# Patient Record
Sex: Male | Born: 1975 | Race: White | Hispanic: No | Smoking: Current every day smoker
Health system: Southern US, Community
[De-identification: ages and names within clinical notes are randomized; demographics above are authoritative.]

## PROBLEM LIST (undated history)

## (undated) DIAGNOSIS — Z22322 Carrier or suspected carrier of Methicillin resistant Staphylococcus aureus: Secondary | ICD-10-CM

## (undated) DIAGNOSIS — K219 Gastro-esophageal reflux disease without esophagitis: Secondary | ICD-10-CM

## (undated) DIAGNOSIS — J382 Nodules of vocal cords: Secondary | ICD-10-CM

## (undated) DIAGNOSIS — R1115 Cyclical vomiting syndrome unrelated to migraine: Secondary | ICD-10-CM

## (undated) HISTORY — PX: HAND SURGERY: SHX662

## (undated) HISTORY — PX: ABDOMINAL SURGERY: SHX537

## (undated) HISTORY — PX: OTHER SURGICAL HISTORY: SHX169

---

## 2001-06-12 ENCOUNTER — Emergency Department (HOSPITAL_COMMUNITY): Admission: EM | Admit: 2001-06-12 | Discharge: 2001-06-12 | Payer: Self-pay | Admitting: Emergency Medicine

## 2001-07-29 ENCOUNTER — Emergency Department (HOSPITAL_COMMUNITY): Admission: EM | Admit: 2001-07-29 | Discharge: 2001-07-29 | Payer: Self-pay | Admitting: *Deleted

## 2001-11-01 ENCOUNTER — Encounter (INDEPENDENT_AMBULATORY_CARE_PROVIDER_SITE_OTHER): Payer: Self-pay | Admitting: *Deleted

## 2001-11-01 ENCOUNTER — Inpatient Hospital Stay (HOSPITAL_COMMUNITY): Admission: AC | Admit: 2001-11-01 | Discharge: 2001-11-07 | Payer: Self-pay

## 2001-11-01 ENCOUNTER — Encounter: Payer: Self-pay | Admitting: General Surgery

## 2001-11-30 ENCOUNTER — Ambulatory Visit (HOSPITAL_COMMUNITY): Admission: RE | Admit: 2001-11-30 | Discharge: 2001-11-30 | Payer: Self-pay | Admitting: General Surgery

## 2002-09-28 ENCOUNTER — Emergency Department (HOSPITAL_COMMUNITY): Admission: EM | Admit: 2002-09-28 | Discharge: 2002-09-28 | Payer: Self-pay | Admitting: Emergency Medicine

## 2002-12-28 ENCOUNTER — Emergency Department (HOSPITAL_COMMUNITY): Admission: EM | Admit: 2002-12-28 | Discharge: 2002-12-28 | Payer: Self-pay | Admitting: Emergency Medicine

## 2003-09-08 ENCOUNTER — Emergency Department (HOSPITAL_COMMUNITY): Admission: EM | Admit: 2003-09-08 | Discharge: 2003-09-08 | Payer: Self-pay | Admitting: Emergency Medicine

## 2004-09-13 ENCOUNTER — Emergency Department (HOSPITAL_COMMUNITY): Admission: EM | Admit: 2004-09-13 | Discharge: 2004-09-13 | Payer: Self-pay | Admitting: Emergency Medicine

## 2005-03-09 ENCOUNTER — Emergency Department (HOSPITAL_COMMUNITY): Admission: EM | Admit: 2005-03-09 | Discharge: 2005-03-09 | Payer: Self-pay | Admitting: Emergency Medicine

## 2006-04-09 ENCOUNTER — Inpatient Hospital Stay (HOSPITAL_COMMUNITY): Admission: EM | Admit: 2006-04-09 | Discharge: 2006-04-12 | Payer: Self-pay | Admitting: Emergency Medicine

## 2006-06-07 ENCOUNTER — Inpatient Hospital Stay (HOSPITAL_COMMUNITY): Admission: EM | Admit: 2006-06-07 | Discharge: 2006-06-12 | Payer: Self-pay | Admitting: Emergency Medicine

## 2006-06-08 ENCOUNTER — Ambulatory Visit: Payer: Self-pay | Admitting: Orthopedic Surgery

## 2006-06-10 ENCOUNTER — Ambulatory Visit: Payer: Self-pay | Admitting: Infectious Diseases

## 2007-03-25 ENCOUNTER — Emergency Department (HOSPITAL_COMMUNITY): Admission: EM | Admit: 2007-03-25 | Discharge: 2007-03-25 | Payer: Self-pay | Admitting: Emergency Medicine

## 2007-03-27 ENCOUNTER — Inpatient Hospital Stay (HOSPITAL_COMMUNITY): Admission: EM | Admit: 2007-03-27 | Discharge: 2007-03-30 | Payer: Self-pay | Admitting: Emergency Medicine

## 2007-04-18 ENCOUNTER — Emergency Department (HOSPITAL_COMMUNITY): Admission: EM | Admit: 2007-04-18 | Discharge: 2007-04-18 | Payer: Self-pay | Admitting: Emergency Medicine

## 2007-04-19 ENCOUNTER — Emergency Department (HOSPITAL_COMMUNITY): Admission: EM | Admit: 2007-04-19 | Discharge: 2007-04-19 | Payer: Self-pay | Admitting: Emergency Medicine

## 2007-06-05 ENCOUNTER — Emergency Department (HOSPITAL_COMMUNITY): Admission: EM | Admit: 2007-06-05 | Discharge: 2007-06-05 | Payer: Self-pay | Admitting: Emergency Medicine

## 2007-09-05 ENCOUNTER — Emergency Department (HOSPITAL_COMMUNITY): Admission: EM | Admit: 2007-09-05 | Discharge: 2007-09-05 | Payer: Self-pay | Admitting: Emergency Medicine

## 2007-10-09 ENCOUNTER — Emergency Department (HOSPITAL_COMMUNITY): Admission: EM | Admit: 2007-10-09 | Discharge: 2007-10-09 | Payer: Self-pay | Admitting: Emergency Medicine

## 2007-10-31 ENCOUNTER — Emergency Department (HOSPITAL_COMMUNITY): Admission: EM | Admit: 2007-10-31 | Discharge: 2007-11-01 | Payer: Self-pay | Admitting: Emergency Medicine

## 2007-12-13 ENCOUNTER — Emergency Department (HOSPITAL_COMMUNITY): Admission: EM | Admit: 2007-12-13 | Discharge: 2007-12-13 | Payer: Self-pay | Admitting: Emergency Medicine

## 2008-02-15 ENCOUNTER — Emergency Department (HOSPITAL_COMMUNITY): Admission: EM | Admit: 2008-02-15 | Discharge: 2008-02-15 | Payer: Self-pay | Admitting: Emergency Medicine

## 2008-04-02 ENCOUNTER — Emergency Department (HOSPITAL_COMMUNITY): Admission: EM | Admit: 2008-04-02 | Discharge: 2008-04-02 | Payer: Self-pay | Admitting: Emergency Medicine

## 2008-04-19 ENCOUNTER — Emergency Department (HOSPITAL_COMMUNITY): Admission: EM | Admit: 2008-04-19 | Discharge: 2008-04-19 | Payer: Self-pay | Admitting: Emergency Medicine

## 2008-05-07 ENCOUNTER — Emergency Department (HOSPITAL_COMMUNITY): Admission: EM | Admit: 2008-05-07 | Discharge: 2008-05-07 | Payer: Self-pay | Admitting: Emergency Medicine

## 2008-05-09 ENCOUNTER — Emergency Department (HOSPITAL_COMMUNITY): Admission: EM | Admit: 2008-05-09 | Discharge: 2008-05-09 | Payer: Self-pay | Admitting: Emergency Medicine

## 2008-05-24 ENCOUNTER — Emergency Department (HOSPITAL_COMMUNITY): Admission: EM | Admit: 2008-05-24 | Discharge: 2008-05-24 | Payer: Self-pay | Admitting: Emergency Medicine

## 2008-10-26 ENCOUNTER — Inpatient Hospital Stay (HOSPITAL_COMMUNITY): Admission: EM | Admit: 2008-10-26 | Discharge: 2008-10-27 | Payer: Self-pay | Admitting: Emergency Medicine

## 2008-10-31 ENCOUNTER — Emergency Department (HOSPITAL_COMMUNITY): Admission: EM | Admit: 2008-10-31 | Discharge: 2008-10-31 | Payer: Self-pay | Admitting: Emergency Medicine

## 2008-11-01 ENCOUNTER — Ambulatory Visit (HOSPITAL_COMMUNITY): Admission: RE | Admit: 2008-11-01 | Discharge: 2008-11-01 | Payer: Self-pay | Admitting: Urology

## 2008-11-02 ENCOUNTER — Inpatient Hospital Stay (HOSPITAL_COMMUNITY): Admission: AD | Admit: 2008-11-02 | Discharge: 2008-11-06 | Payer: Self-pay | Admitting: Urology

## 2008-11-24 ENCOUNTER — Emergency Department (HOSPITAL_COMMUNITY): Admission: EM | Admit: 2008-11-24 | Discharge: 2008-11-24 | Payer: Self-pay | Admitting: Emergency Medicine

## 2008-12-07 ENCOUNTER — Emergency Department (HOSPITAL_COMMUNITY): Admission: EM | Admit: 2008-12-07 | Discharge: 2008-12-07 | Payer: Self-pay | Admitting: Emergency Medicine

## 2008-12-09 ENCOUNTER — Emergency Department (HOSPITAL_COMMUNITY): Admission: EM | Admit: 2008-12-09 | Discharge: 2008-12-09 | Payer: Self-pay | Admitting: Emergency Medicine

## 2009-01-07 ENCOUNTER — Emergency Department (HOSPITAL_COMMUNITY): Admission: EM | Admit: 2009-01-07 | Discharge: 2009-01-07 | Payer: Self-pay | Admitting: Emergency Medicine

## 2009-12-26 IMAGING — CT CT PELVIS W/O CM
1 series · 15 of 32 positions shown, 19 images · non-contrast
Comparison: CT pelvis 09/13/2004

CLINICAL DATA: Right groin pain, inflammatory process of right
hemiscrotum on prior ultrasound of 10/27/2008

CT PELVIS WITHOUT CONTRAST
TECHNIQUE: Multidetector CT imaging of the pelvis was performed
following the standard protocol without intravenous contrast.

[Series 2: pelvis 5.0 b40f · axial · 0.78mm/px · z∈[-804,-514]mm · 15 of 66 slices shown, 19 images]
[im 5/66  soft-tissue]
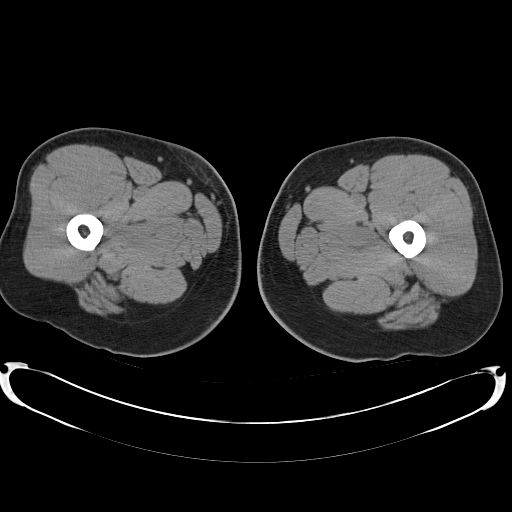
[im 5/66  bone]
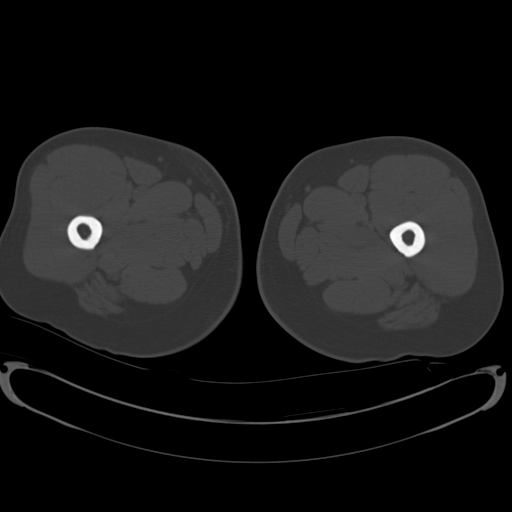
[im 9/66  soft-tissue]
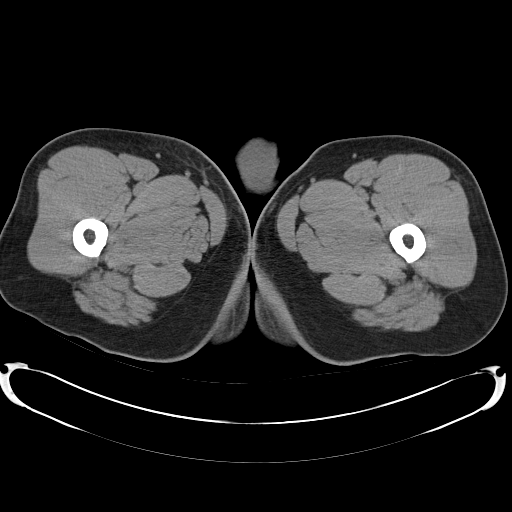
[im 13/66  soft-tissue]
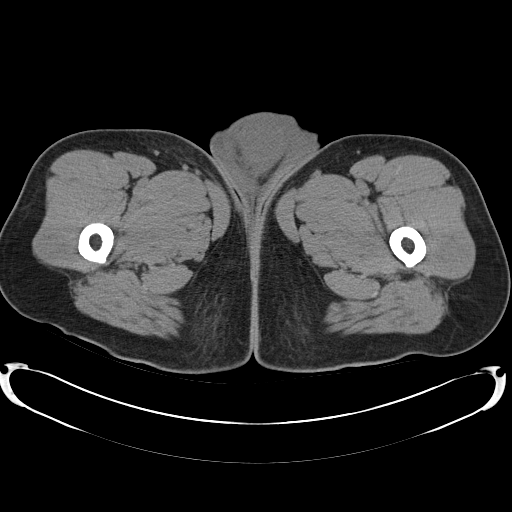
[im 19/66  soft-tissue]
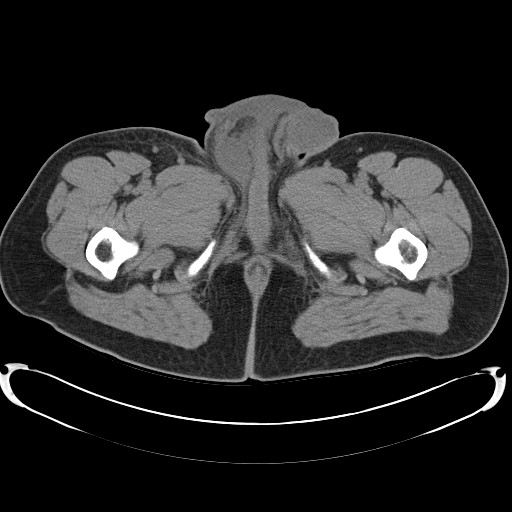
[im 24/66  soft-tissue]
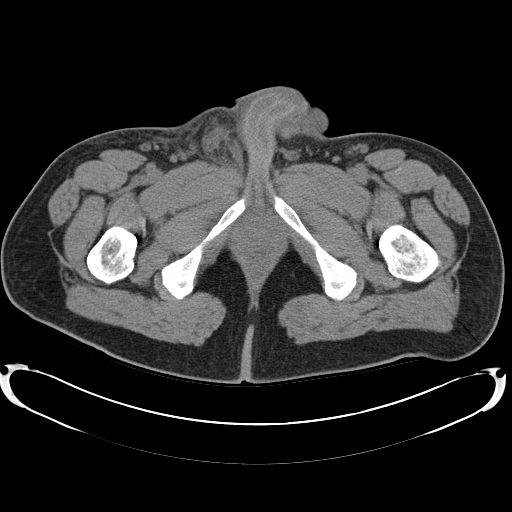
[im 28/66  soft-tissue]
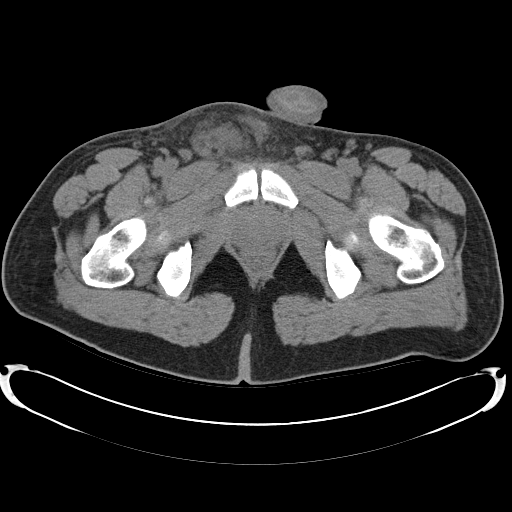
[im 34/66  soft-tissue]
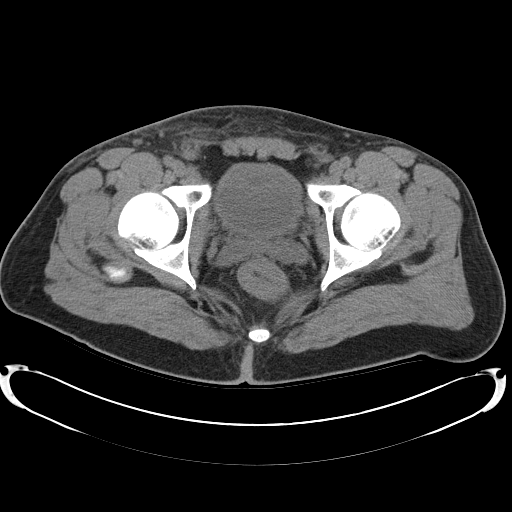
[im 38/66  soft-tissue]
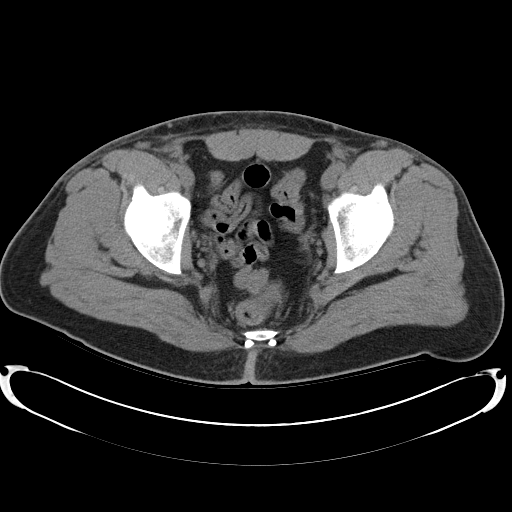
[im 42/66  soft-tissue]
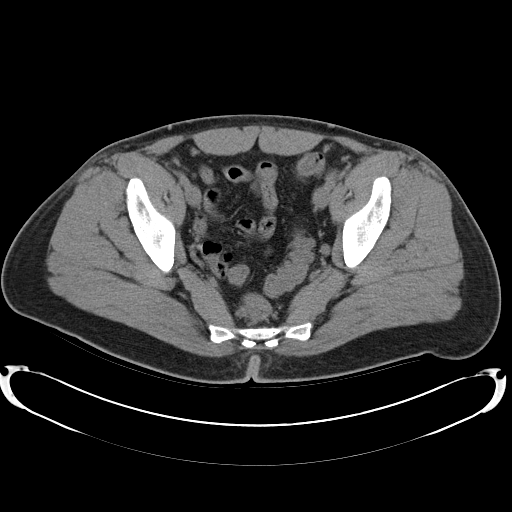
[im 42/66  bone]
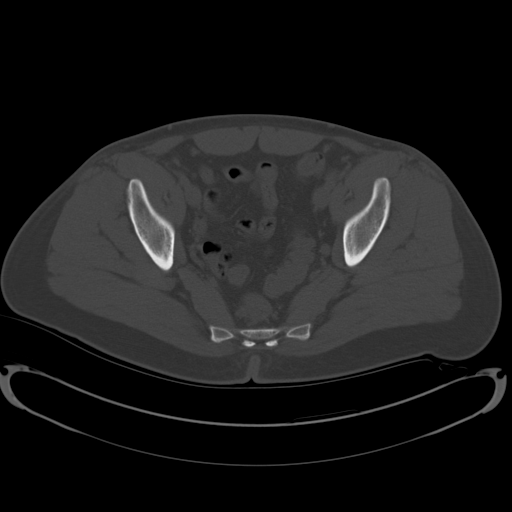
[im 47/66  soft-tissue]
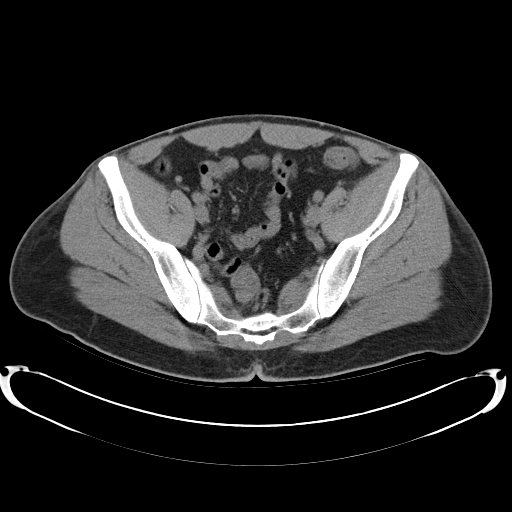
[im 53/66  soft-tissue]
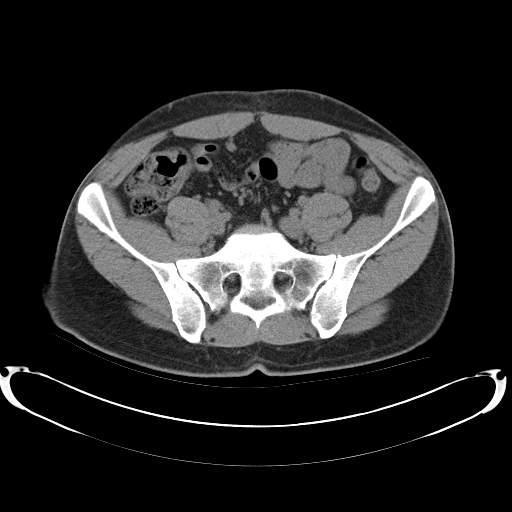
[im 57/66  soft-tissue]
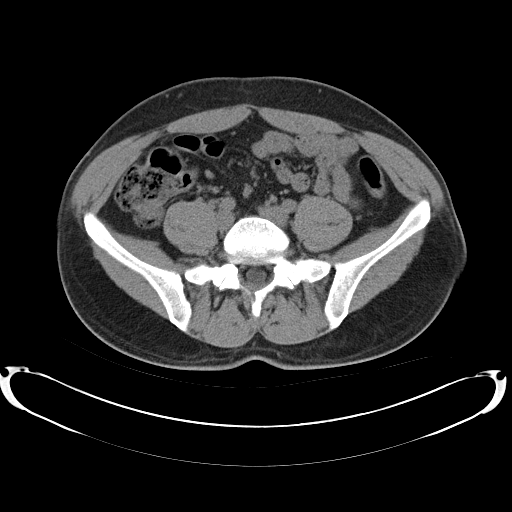
[im 57/66  lung]
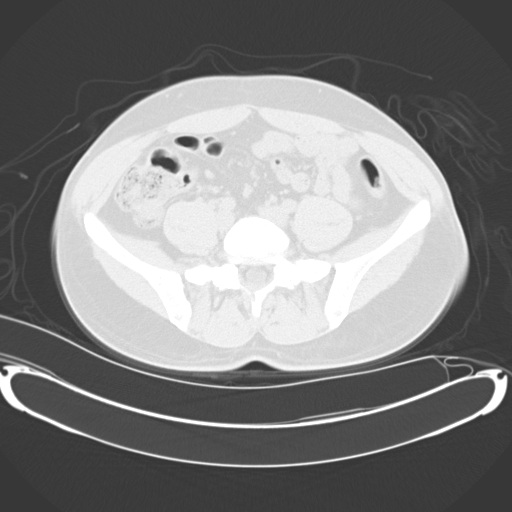
[im 59/66  lung]
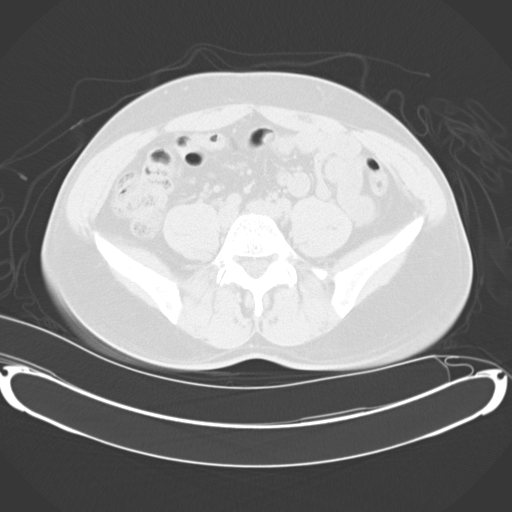
[im 61/66  soft-tissue]
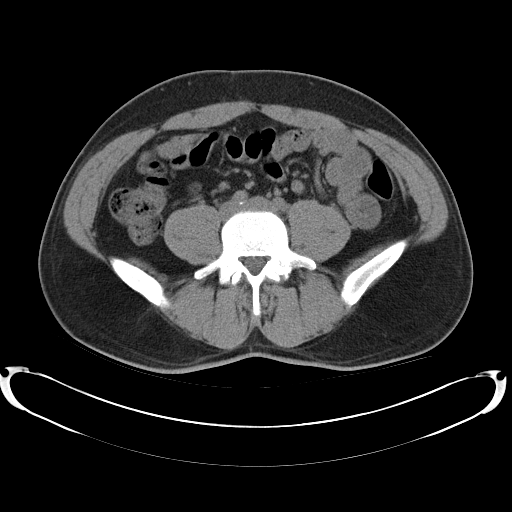
[im 61/66  lung]
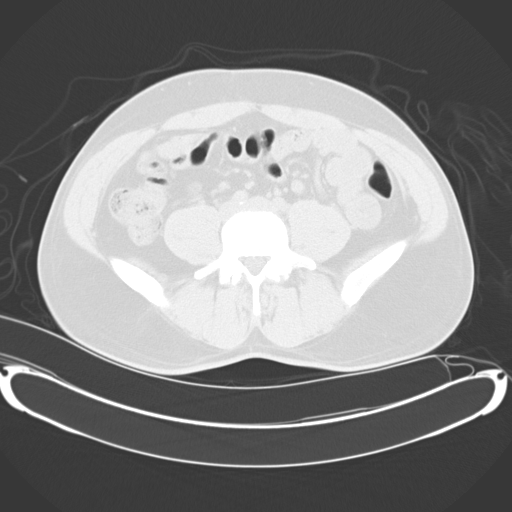
[im 63/66  lung]
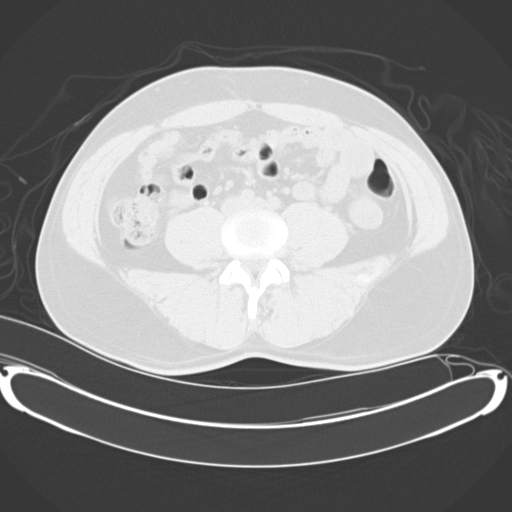

[15 of 32 positions shown; findings below may reference images not displayed]

FINDINGS: Large and small bowel loops in pelvis normal.
Normal appendix.
Partially distended bladder, minimally prominent wall.
No intrapelvic mass, adenopathy, or free fluid.

Extensive inflammatory changes identified from right hemiscrotum
superiorly to right internal inguinal ring.
Extensive skin thickening, soft tissue edema, and scattered normal-
sized but numerous reactive lymph nodes.
Small focal nodule identified, 3.3 x 2.5 cm image 47, question
small abscess or the mildly enlarged inguinal canal node identified
on prior sonogram.
No definite soft tissue gas identified along the right inguinal
canal.
On coronal images, two tiny foci of gas are seen in inferior right
hemiscrotum.
The patient indicates that his physician performed an aspiration of
this site approximally 5 days ago, uncertain if the observed gas
foci are related to prior aspiration, though this makes it
impossible to exclude early infection by gas forming organism.
No pathologic soft tissue calcification.
No inguinal hernia.
Unremarkable bones with symmetric muscular planes.
IMPRESSION: Inflammatory process involving right inguinal canal and right
hemiscrotum, suspicious for infectious process though missed
torsion must also be considered.
Two tiny foci of soft tissue gas at inferior posterior right
hemiscrotum, question related to preceding aspiration 5 days ago,
making it impossible to completely exclude Bach gangrene.
No other soft tissue gas foci are identified.
With persistent symptoms and foci of soft tissue gas, and in light
of partial small abscess identified within right hemiscrotum on
prior ultrasound, recommend follow-up/repeat ultrasound to
evaluate.
Findings discussed with Dr. Nikusha prior to dictation of this
report.

## 2010-02-14 ENCOUNTER — Emergency Department (HOSPITAL_COMMUNITY): Admission: EM | Admit: 2010-02-14 | Discharge: 2010-02-14 | Payer: Self-pay | Admitting: Emergency Medicine

## 2010-04-16 ENCOUNTER — Emergency Department (HOSPITAL_COMMUNITY): Admission: EM | Admit: 2010-04-16 | Discharge: 2010-04-17 | Payer: Self-pay | Admitting: Emergency Medicine

## 2010-06-15 ENCOUNTER — Emergency Department (HOSPITAL_COMMUNITY): Admission: EM | Admit: 2010-06-15 | Discharge: 2010-06-15 | Payer: Self-pay | Admitting: Emergency Medicine

## 2010-08-11 ENCOUNTER — Emergency Department (HOSPITAL_COMMUNITY): Admission: EM | Admit: 2010-08-11 | Discharge: 2010-08-11 | Payer: Self-pay | Admitting: Emergency Medicine

## 2011-02-10 LAB — CBC
HCT: 39.2 % (ref 39.0–52.0)
HCT: 39.3 % (ref 39.0–52.0)
HCT: 41.3 % (ref 39.0–52.0)
Hemoglobin: 12.9 g/dL — ABNORMAL LOW (ref 13.0–17.0)
Hemoglobin: 13 g/dL (ref 13.0–17.0)
Hemoglobin: 13.1 g/dL (ref 13.0–17.0)
MCHC: 33.1 g/dL (ref 30.0–36.0)
MCHC: 33.4 g/dL (ref 30.0–36.0)
MCV: 89.2 fL (ref 78.0–100.0)
MCV: 89.9 fL (ref 78.0–100.0)
Platelets: 254 10*3/uL (ref 150–400)
Platelets: 302 10*3/uL (ref 150–400)
RBC: 4.31 MIL/uL (ref 4.22–5.81)
RBC: 4.34 MIL/uL (ref 4.22–5.81)
RDW: 12.4 % (ref 11.5–15.5)
RDW: 12.4 % (ref 11.5–15.5)
RDW: 12.5 % (ref 11.5–15.5)
RDW: 12.6 % (ref 11.5–15.5)
WBC: 11.1 10*3/uL — ABNORMAL HIGH (ref 4.0–10.5)
WBC: 7.1 10*3/uL (ref 4.0–10.5)

## 2011-02-10 LAB — DIFFERENTIAL
Basophils Absolute: 0 10*3/uL (ref 0.0–0.1)
Basophils Absolute: 0 10*3/uL (ref 0.0–0.1)
Basophils Absolute: 0 10*3/uL (ref 0.0–0.1)
Basophils Absolute: 0.1 10*3/uL (ref 0.0–0.1)
Basophils Relative: 0 % (ref 0–1)
Basophils Relative: 0 % (ref 0–1)
Basophils Relative: 2 % — ABNORMAL HIGH (ref 0–1)
Eosinophils Absolute: 0.3 10*3/uL (ref 0.0–0.7)
Eosinophils Absolute: 0.4 10*3/uL (ref 0.0–0.7)
Eosinophils Relative: 3 % (ref 0–5)
Eosinophils Relative: 5 % (ref 0–5)
Lymphocytes Relative: 36 % (ref 12–46)
Lymphocytes Relative: 36 % (ref 12–46)
Lymphs Abs: 2.5 10*3/uL (ref 0.7–4.0)
Lymphs Abs: 3.1 10*3/uL (ref 0.7–4.0)
Monocytes Absolute: 0.6 10*3/uL (ref 0.1–1.0)
Monocytes Absolute: 0.7 10*3/uL (ref 0.1–1.0)
Monocytes Relative: 10 % (ref 3–12)
Monocytes Relative: 10 % (ref 3–12)
Monocytes Relative: 9 % (ref 3–12)
Neutro Abs: 3.6 10*3/uL (ref 1.7–7.7)
Neutro Abs: 4.5 10*3/uL (ref 1.7–7.7)
Neutro Abs: 7.6 10*3/uL (ref 1.7–7.7)
Neutrophils Relative %: 57 % (ref 43–77)
Neutrophils Relative %: 72 % (ref 43–77)

## 2011-02-10 LAB — COMPREHENSIVE METABOLIC PANEL
ALT: 17 U/L (ref 0–53)
Alkaline Phosphatase: 49 U/L (ref 39–117)
BUN: 5 mg/dL — ABNORMAL LOW (ref 6–23)
CO2: 25 mEq/L (ref 19–32)
Chloride: 104 mEq/L (ref 96–112)
GFR calc non Af Amer: >60 mL/min (ref >60)
Glucose, Bld: 106 mg/dL — ABNORMAL HIGH (ref 70–99)
Potassium: 3.5 mEq/L (ref 3.5–5.1)
Sodium: 138 mEq/L (ref 135–145)
Total Bilirubin: 0.7 mg/dL (ref 0.3–1.2)
Total Protein: 6.3 g/dL (ref 6.0–8.3)

## 2011-02-10 LAB — BASIC METABOLIC PANEL
BUN: 6 mg/dL (ref 6–23)
CO2: 27 mEq/L (ref 19–32)
CO2: 27 mEq/L (ref 19–32)
Calcium: 8.8 mg/dL (ref 8.4–10.5)
Chloride: 105 mEq/L (ref 96–112)
Creatinine, Ser: 0.77 mg/dL (ref 0.4–1.5)
Creatinine, Ser: 0.85 mg/dL (ref 0.4–1.5)
GFR calc Af Amer: >60 mL/min (ref >60)
GFR calc non Af Amer: >60 mL/min (ref >60)
Glucose, Bld: 101 mg/dL — ABNORMAL HIGH (ref 70–99)
Potassium: 3.7 mEq/L (ref 3.5–5.1)

## 2011-02-10 LAB — GLUCOSE, CAPILLARY: Glucose-Capillary: 136 mg/dL — ABNORMAL HIGH (ref 70–99)

## 2011-02-10 LAB — WOUND CULTURE

## 2011-03-11 NOTE — Discharge Summary (Signed)
[name], [name] NO.:  [id number]   MEDICAL RECORD NO.:  [id number]          PATIENT TYPE:  INP   LOCATION:  A326                          FACILITY:  [facility]   PHYSICIAN:  Skeet Latch, DO    DATE OF BIRTH:  1975/12/17   DATE OF ADMISSION:  03/27/2007  DATE OF DISCHARGE:  06/03/2008LH                               DISCHARGE SUMMARY   DISCHARGE DIAGNOSIS:  Right arm abscess secondary to Methicillin  resistant Staphylococcus aureus.   BRIEF HOSPITAL COURSE:  Mr. Rhinehart is a 35 year old Caucasian male who  presented with approximately a 3-week history of right arm pain,  swelling and infection.  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  The  patient has been on multiple antibiotics for these infections and states  that they continue to recur.  The patient presented to [facility] back  in August 2007 with a MRSA infection of his right hand and he was placed  on IV vancomycin which caused thrombocytopenia and the patient was  transferred to [facility] for irrigation and debridement.  Infectious  Disease was consulted and patient was placed on IV antibiotics.  The  patient recovered and was sent home on p.o. antibiotics.  On this  admission, the patient presented with right forearm abscess that he was  placed on p.o. antibiotics a few days prior to admission and states that  it did not help this infection.  The patient was seen, admitted, placed  on IV cubicin for a few days and patient's swelling and edema has  improved.   DISCHARGE PHYSICAL EXAMINATION:  VITAL SIGNS:  Temperature 97.4, pulse  54, respirations 18, blood pressure 113/68.  CARDIOVASCULAR:  Regular rate and rhythm.  No murmurs, rubs or gallops.  LUNGS:  Clear to auscultation bilaterally.  No rales, rhonchi or  wheezing.  ABDOMEN:  Soft, nontender, nondistended.  EXTREMITIES:  His right arm has an abscess that has improved  in size.  Still has some minimal drainage, still has some erythema, but is greatly  improved.  No obvious edema noted.   LABORATORY DATA:  White blood cell count 7.0, hemoglobin 13.3,  hematocrit 38.2, platelets 127.   DISCHARGE MEDICATIONS:  The patient will be sent home on Bactrim DS one  tab p.o. b.i.d. x10 days and doxycycline 100 mg once daily for 10 days.   CONDITION ON DISCHARGE:  Stable.   DISCHARGE DIET:  No restrictions.   WOUND CARE:  The patient is to keep the area clean with soap and water 2-  3 times daily.   DISCHARGE ACTIVITIES:  The patient is to slowly increase his activity.   DISCHARGE FOLLOWUP:  The patient is to follow up with Dr. Nobie Putnam in  approximately 1 week.   DISCHARGE INSTRUCTIONS:  Will refer to Dr. Nobie Putnam if patient may need  an Infectious Disease consult in the near future if his MRSA returns.  The patient is instructed to follow up with Dr. Nobie Putnam if he is having  complications or follow up in  the emergency room.      Skeet Latch, DO  Electronically Signed     SM/MEDQ  D:  03/30/2007  T:  03/30/2007  Job:  [id number]   cc:   Patrica Duel, M.D.  Fax: [phone]

## 2011-03-11 NOTE — Group Therapy Note (Signed)
NAMEVASILIOS, OTTAWAY NO.:  [id number]   MEDICAL RECORD NO.:  [id number]          PATIENT TYPE:  INP   LOCATION:  A326                          FACILITY:  [facility]   PHYSICIAN:  Skeet Latch, DO    DATE OF BIRTH:  03/27/1976   DATE OF PROCEDURE:  03/29/2007  DATE OF DISCHARGE:                                 PROGRESS NOTE   SUBJECTIVE:  Mr. Ohlrich states that his right arm abscess is improved  today.  The patient states that it is still draining, but minimally at  this time.  The patient states the pain and swelling is also improved  today and the patient has no other complaints this a.m.   OBJECTIVE:  VITAL SIGNS:  Temperature 98, pulse 67, respirations 20,  blood pressure 99/46.  CARDIAC:  Regular rate and rhythm, no murmurs, rubs or gallops  appreciated.  LUNGS:  Clear to auscultation bilaterally with no rales, rhonchi or  wheezing.  ABDOMEN:  Soft, nontender, nondistended.  EXTREMITIES:  Right forearm with decreased edema.  Still has some  purulent drainage from right arm abscess, but it is improved.  Slightly  tender to the touch, but pain has improved.   LABORATORY DATA AND X-RAY FINDINGS:  Abscess culture showed a few  Staphylococcus aureus.  Blood culture negative x2 days.   ASSESSMENT/PLAN:  1. Cellulitis probably secondary to methicillin-resistant      Staphylococcus aureus.  The patient was started on intravenous and      will continue this for now.  Will continue elevation and cold      compresses to area.  2. Plan is to convert the patient probably to oral medications      tomorrow and possibly discharge him to home.      Skeet Latch, DO  Electronically Signed     SM/MEDQ  D:  03/29/2007  T:  03/30/2007  Job:  [id number]

## 2011-03-11 NOTE — Group Therapy Note (Signed)
NAMETYSHUN, TUCKERMAN NO.:  [id number]   MEDICAL RECORD NO.:  [id number]          PATIENT TYPE:  INP   LOCATION:  A311                          FACILITY:  [facility]   PHYSICIAN:  Dorris Singh, DO    DATE OF BIRTH:  12/05/1975   DATE OF PROCEDURE:  11/04/2008  DATE OF DISCHARGE:                                 PROGRESS NOTE   The patient seen today.  States he feels about same.  Pain has not  worsened.  He has been using hot compresses but it has not gotten worse.   Temperature 97.9, pulse 77, respirations 12, blood pressure 125/74.  GENERAL:  The patient is well-developed, well-nourished, in no acute  distress.  HEART:  Regular rhythm.  LUNGS:  Clear auscultation bilaterally.  ABDOMEN:  Soft, nontender and nondistended.  GU is bandaged, however, there is swelling in the in the right groin  region.  EXTREMITIES:  Positive pulses.  No edema, ecchymosis, cyanosis.   His CBC for today demonstrates white count 7.9, hemoglobin of 12.7,  hematocrit of 36.3, platelet count of 254.   ASSESSMENT/PLAN:  Abscess of the right scrotum with swelling to the  right inguinal area.  We will continue with IV antibiotics warm  compresses and the patient seen to be doing well.  Will continue to  monitor.      Dorris Singh, DO  Electronically Signed     CB/MEDQ  D:  11/04/2008  T:  11/04/2008  Job:  [id number]
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
## 2011-03-11 NOTE — Op Note (Signed)
[name], Shane [name] NO.:  [id number]   MEDICAL RECORD NO.:  [id number]         PATIENT TYPE:  PINP   LOCATION:  A340                          FACILITY:  [facility]   PHYSICIAN:  Ky Barban, M.D.DATE OF BIRTH:  January 09, 1976   DATE OF PROCEDURE:  DATE OF DISCHARGE:                               OPERATIVE REPORT   PREOPERATIVE DIAGNOSIS:  Right scrotal abscess.   POSTOPERATIVE DIAGNOSIS:  Right scrotal abscess.   PROCEDURE:  Incision and drainage of scrotal abscess.   ANESTHESIA:  1% Xylocaine.   PROCEDURE IN DETAIL:  The patient was placed in frog-leg position.  After usual prep and drape; the abscess, which was located, in the right  hemiscrotum at its junction with the perineum overlying area was cleaned  with Betadine.  He was prepped and draped in the usual sterile fashion  and the overlying skin of the scrotal abscess was infiltrated with 1 mL  of 1% Xylocaine, then a 1-inch incision was made.  Few mL of thick  purulent material came out.  It was a small cavity and the culture was  taken from the abscess and the abscess cavity was packed and dressed  with no complications.      Ky Barban, M.D.  Electronically Signed     MIJ/MEDQ  D:  10/27/2008  T:  10/27/2008  Job:  [id number]

## 2011-03-11 NOTE — Discharge Summary (Signed)
NAMESARAH, ZERBY NO.:  [id number]   MEDICAL RECORD NO.:  [id number]          PATIENT TYPE:  INP   LOCATION:  A340                          FACILITY:  [facility]   PHYSICIAN:  Osvaldo Shipper, MD     DATE OF BIRTH:  08-01-1976   DATE OF ADMISSION:  10/26/2008  DATE OF DISCHARGE:  LH                               DISCHARGE SUMMARY   The patient does not have a primary medical doctor.   Please review H and P dictated by Dr. Karilyn Cota for details regarding the  patient's presenting illness.   CONSULTATION DURING THIS ADMISSION:  By Ky Barban, M.D.   PROCEDURES UNDERGONE INCLUDE:  I and D of the scrotal abscess.   DISCHARGE DIAGNOSES:  1. Scrotal abscess.  2. History of skin infections in the past.   BRIEF HOSPITAL COURSE:  Briefly, this is a 35 year old Caucasian male  who presented with complaints of pain in his scrotal area and also skin  lesions on his back.  The patient was admitted for further evaluation.  He was found to have a possible abscess.  He underwent a scrotal  ultrasound which revealed evidence for normal testes, however, there was  a 3-cm scrotal abscess on the right, complex hydrocele on the left, and  some complex fluid in the left inguinal canal was also noted.  The  patient was seen by Dr. Jerre Simon who did an I and D.  Dr. Jerre Simon offered  the patient discharge and close followup as an outpatient and the  patient has agreed to this, and Dr. Frann Rider has chosen Levaquin as his  antibiotic of choice for this patient, and he has also prescribed him  some oxycodone.   The patient's vital signs are all stable.  He is normotensive, he is  afebrile.  His white cell count was slightly elevated at 10.6 this  morning.  Dr. Jerre Simon will follow up on the results of the wound cultures  and will see him in his office on Monday.  He has also instructed the  patient that if his pain were to get worse, or if he were to develop  high fevers, he needed to  seek attention immediately.  The patient has  also been informed about the risks of Fournier gangrene.   The discharge instructions were written by Dr. Jerre Simon.      Osvaldo Shipper, MD  Electronically Signed     GK/MEDQ  D:  10/27/2008  T:  10/27/2008  Job:  [id number]
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
## 2011-03-11 NOTE — Group Therapy Note (Signed)
NAMETEREZ, FREIMARK NO.:  [id number]   MEDICAL RECORD NO.:  [id number]          PATIENT TYPE:  INP   LOCATION:  A311                          FACILITY:  [facility]   PHYSICIAN:  Dorris Singh, DO    DATE OF BIRTH:  08/17/1976   DATE OF PROCEDURE:  DATE OF DISCHARGE:                                 PROGRESS NOTE   Patient seen today.  States that he has not really had an improvement.  He feels about the same.  From his medical standpoint, he has not had  any issues.   VITALS:  Temperature 97.5.  Pulse 67.  Respirations 12.  Blood pressure  114/83.  Pulse ox 99% on room air.  GENERAL:  He is well developed, well nourished, no acute distress.  HEART:  Regular rate and rhythm.  LUNGS:  Clear to auscultation bilaterally.  ABDOMEN:  Soft, nontender, nondistended.  GU:  Positive swelling of the right inguinal area and scrotum is  bandaged.   White count is 7.1, hemoglobin 12.9, hematocrit 38.6, platelet count of  262.   ASSESSMENT AND PLAN:  Right scrotal abscess.  We will continue with  dressing changes.  Dr. Jerre Simon is on the case who is the primary.  We  will await for any further recommendations that he has on him at this  point in time.      Dorris Singh, DO  Electronically Signed     CB/MEDQ  D:  11/05/2008  T:  11/05/2008  Job:  [id number]
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
## 2011-03-11 NOTE — H&P (Signed)
Shane [name], MCLINDEN NO.:  [id number]   MEDICAL RECORD NO.:  [id number]          PATIENT TYPE:  INP   LOCATION:  A311                          FACILITY:  [facility]   PHYSICIAN:  Ky Barban, M.D.DATE OF BIRTH:  [birth year]   DATE OF ADMISSION:  11/02/2008  DATE OF DISCHARGE:  LH                              HISTORY & PHYSICAL   CHIEF COMPLAINT:  Right scrotal abscess.   HISTORY:  A 35 year old gentleman was in the hospital last week.  He had  a scrotal abscess, which I drained, and ultrasound at that time showed  there is a collection of fluid in the right inguinal canal, but  clinically I could not feel if there is any an sign of abscess in that  area and I thought it was just inflammatory reaction, so I decided to  treat him with antibiotics.  After draining the abscess, he was sent  home and the culture report subsequently did not show any organisms that  was surprising because it was a purulent material what they sent.  Anyway, he has been followed since then in the office.  The scrotal  abscess area is healing up okay, but he continue to have severe  discomfort in the right inguinal canal area, and there is a little bit  more puffiness in that area, which is stable and so I did another CT and  ultrasound of that area.  Yesterday, it showed there is little bit  increase in fluid collection over the area, it could be abscess or just  clear inflammatory tissue reaction.  There is a lot of thickness of the  spermatic cord and the epididymis on that side.  The testicles are okay.  The blood supply of this areas is alright, and I still feel clinically  it looks like inflammatory reaction with antibiotic, which he never took  any except some doxycycline, which he takes for long time, so I decided  to admit him in the hospital, give him IV vancomycin, and if he  continues to have severe pain, I may have to drain the inguinal canal,  open it up and see if there is  abscess or just clear fluid.  He is not  running any fevers, so he is being admitted today.   PAST MEDICAL HISTORY:  History of multiple abscesses like this in the  past in different areas of his body.  No other significant medical  problems.   PHYSICAL EXAMINATION:  VITAL SIGNS:  Afebrile.  Blood pressure 130/88.  GENERAL:  Fully conscious, alert, oriented.  CENTRAL NERVOUS SYSTEM:  Negative.  HEENT:  Negative.  CHEST:  Symmetrical.  HEART:  Regular sinus rhythm.  No murmur.  ABDOMEN:  Soft, flat.  Liver, spleen, kidneys are not palpable.  Marked  tenderness in the right groin.  Slight puffiness in that area, but the  skin color and blood supply looks fine.  I do not see if it looks like  any cellulitis or abscess in that area, but except some tenderness.  The  other day I have put a  needle to aspirate this fluid under local  anesthesia.  I could not get any aspiration.  GU:  Anyway, the testicles are normal except right hemiscrotum is  slightly puffy and the recently drained abscess, which is located in the  right scrotum inferior part where it joints with the perineum, but there  is no drainage of the abscess, cavity is open, but I do not see any  tenderness in that area or any drainage.  It is healing up slowly.  The  left testicle and left hemiscrotum is fine.   IMPRESSION:  Abscess of right hemiscrotum. So I am going to start him on  IV antibiotics and I am trying to find out if he is allergic to  vancomycin or not.      Ky Barban, M.D.  Electronically Signed     MIJ/MEDQ  D:  11/02/2008  T:  11/03/2008  Job:  [id number]
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
## 2011-03-14 NOTE — Discharge Summary (Signed)
NAMECONSTANTIN, Shane [name] NO.:  [id number]   MEDICAL RECORD NO.:  [id number]         PATIENT TYPE:  PINP   LOCATION:  A311                          FACILITY:  [facility]   PHYSICIAN:  Ky Barban, M.D.DATE OF BIRTH:  June 23, 1976   DATE OF ADMISSION:  DATE OF DISCHARGE:  01/11/2010LH                               DISCHARGE SUMMARY   CHIEF COMPLAINT:  Right scrotal mass.   A 35 year old gentleman presented in the emergency room with possible  MRSA scrotal abscess.  He had a small localized abscess in the perineum  at the junction of scrotum and the perineum on the right side.  He has a  history of having MRSA infections several times.  He is allergic to  PENICILLIN and VANCOMYCIN.  He has been taking doxycycline as  suppressive therapy through his physician for some time.  He was  admitted.  On admission, his CBC count shows WBC count of 11.1 and his  hematocrit is 41.3, sodium 135, potassium 3.7, chloride 105, CO2 is 27,  glucose is 105, creatinines 0.85, BUN is 6.  In addition to this  localized abscess, his right hemiscrotum was also swollen and red, and  the swelling was going in the right groin in the inguinal area and I  drained the abscess in the perineum and subsequently the culture did not  grow anything, and probably he was on suppressive therapy because of  that.  He was also evaluated by the hospitalist service.  On admission,  I put him on gentamicin 80 mg IV and clinically I could see that there  was some improvement and with consultation with the pharmacy, it was  decided that we can put him on oral Zyvox and he can be treated as  outpatient and at this point, I am going to discharge him home.   FINAL DISCHARGE DIAGNOSIS:  Right scrotal abscess with cellulitis.   DISCHARGE CONDITION:  Improving.   DISCHARGE MEDICATIONS:  1. Zyvox 600 mg p.o. b.i.d. for 7 days.  2. Oxycodone 5/500 one q.6 p.r.n. #30.  I will see him in the office      on Thursday  morning.      Ky Barban, M.D.  Electronically Signed     MIJ/MEDQ  D:  12/15/2008  T:  12/16/2008  Job:  [id number]

## 2011-03-14 NOTE — Discharge Summary (Signed)
NAMEDJON, TITH NO.:  [id number]   MEDICAL RECORD NO.:  [id number]          PATIENT TYPE:  INP   LOCATION:  5003                         FACILITY:  [facility]   PHYSICIAN:  Nadara Mustard, MD     DATE OF BIRTH:  29-Jan-1976   DATE OF ADMISSION:  06/07/2006  DATE OF DISCHARGE:  06/12/2006                                 DISCHARGE SUMMARY   FINAL DIAGNOSIS:  Methicillin-resistant staph infection of right hand.   PROCEDURE:  Irrigation and debridement as well as starting IV antibiotics.   Discharged to home in stable condition.   CONSULTANT:  Infectious disease was consulted.  Plan to follow-up in the  office in 1 week.   HISTORY OF PRESENT ILLNESS:  The patient is a 35 year old gentleman who was  admitted to [facility] for 3 days with a right hand abscess.  The  patient was initially admitted by Dr. Romeo Apple, was placed on vancomycin for  cultures which were previously positive for MRSA.  The patient developed  thrombocytopenia and then was subsequently transferred to [facility] for  evaluation and treatment.  The patient was admitted to [facility] on August  14.  He underwent initial irrigation and debridement of the abscess to the  right hand.  After infectious disease consult,  The patient was placed on  Cubicin, and this was approved by Dr. Ninetta Lights.  The patient was started on  hydrotherapy daily for his infection.  His thrombocytopenia resolved slowly.  The patient's white blood cell count also resolved.  The patient's hand  showed remarkable improvement, and he was discharged to home in stable  condition with a prescription for doxycycline for the presumed MRSA  infection and Tylox for pain.      Nadara Mustard, MD  Electronically Signed     MVD/MEDQ  D:  07/01/2006  T:  07/01/2006  Job:  [id number]
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
## 2011-03-14 NOTE — Op Note (Signed)
Shane [name], Shane [name] NO.:  [id number]   MEDICAL RECORD NO.:  [id number]          PATIENT TYPE:  INP   LOCATION:  A305                          FACILITY:  [facility]   PHYSICIAN:  Dirk Dress. Katrinka Blazing, M.D.   DATE OF BIRTH:  [birth year]   DATE OF PROCEDURE:  04/10/2006  DATE OF DISCHARGE:                                 OPERATIVE REPORT   PREOPERATIVE DIAGNOSIS:  Perineal and groin abscess.   POSTOPERATIVE DIAGNOSIS:  Perineal and groin abscess.   PROCEDURE:  Wide excision and drainage of perineal and groin abscess.   SURGEON:  Dirk Dress. Katrinka Blazing, M.D.   DESCRIPTION OF PROCEDURE:  Under general anesthesia the peritoneum,  genitalia, and perianal area were prepped and draped in a sterile field.  There was a long, tubular inflamed area that started in the buttock, close  to the anus, but did not seem to communicate with the anus.  It extended  across the perineum and into the right groin at the base of the scrotum.  It  was a small aperture which was probed with a long probe. Cautery was used to  widely excised the necrotic and inflamed tissue down to good tissue.   The wound was packed open with Iodoform gauze.  Perineal pack was placed.  The patient was awakened from anesthesia , transferred to a bed, and taken  to the postanesthetic care unit for monitoring.      Dirk Dress. Katrinka Blazing, M.D.  Electronically Signed     LCS/MEDQ  D:  04/10/2006  T:  04/10/2006  Job:  [id number]

## 2011-03-14 NOTE — Group Therapy Note (Signed)
NAMEARDELL, Shane [name] NO.:  [id number]   MEDICAL RECORD NO.:  [id number]          PATIENT TYPE:  INP   LOCATION:  A308                          FACILITY:  [facility]   PHYSICIAN:  Osvaldo Shipper, MD     DATE OF BIRTH:  [birth year]   DATE OF PROCEDURE:  06/08/2006  DATE OF DISCHARGE:                                   PROGRESS NOTE   SUBJECTIVE:  Patient is still complaining of severe pain in his right hand.  He stated the pain medication had not helped him at all.  He is also  wondering if his hand lesion needs to be incised and drained.   OBJECTIVE:  VITAL SIGNS:  He is afebrile, heart rate 60s, respiratory rate  18, blood pressure 172/92, saturations not recorded.  LUNGS:  Clear to auscultation bilaterally.  CARDIOVASCULAR:  S1 and S2 is normal, regular, no murmurs appreciated.  EXTREMITIES:  Right hand reveals swelling involving the entire hand  including the fingers.  There are some focal areas of erythema noted.  The  peripheral pulses are palpable.  There is tenderness present over the  inflamed areas as well.   LABORATORY DATA:  No labs available today.  Yesterday, his white count was  16,500.  Potassium was 3.4, which was replaced.   X-ray of the hand from yesterday showed soft tissue swelling with no foreign  body or underlying bony abnormalities.   ASSESSMENT/PLAN:  Cellulitis of the right hand.  Patient is on vancomycin  because of history of methicillin resistant Staphylococcus aureus in the  past.  He remains afebrile.  Will recheck his white count tomorrow morning.  He is being seen by orthopedic surgeon, Dr. Romeo Apple, who does not think  that this is tenosynovitis.  No indication for I&D per him.  He will  continue to follow this patient.  Since his pain is not being controlled  with current dosage of Dilaudid, will increase the dosage for now.   Will recheck his electrolytes tomorrow morning to make sure his potassium  has been corrected.   Anticipate patient requiring inpatient care for at least 2-3 days more with  IV antibiotics.  Will also require a daily input from the surgeon regarding  any indication for I&D in this patient.      Osvaldo Shipper, MD  Electronically Signed     GK/MEDQ  D:  06/08/2006  T:  06/08/2006  Job:  [id number]

## 2011-07-24 LAB — WOUND CULTURE

## 2011-07-25 LAB — RAPID URINE DRUG SCREEN, HOSP PERFORMED
Amphetamines: NOT DETECTED
Barbiturates: NOT DETECTED
Opiates: POSITIVE — AB

## 2011-07-25 LAB — BASIC METABOLIC PANEL
BUN: 7
Chloride: 107
Glucose, Bld: 81
Potassium: 4.1

## 2011-07-25 LAB — DIFFERENTIAL
Eosinophils Absolute: 0.3
Eosinophils Relative: 4
Lymphs Abs: 1.6

## 2011-07-25 LAB — CBC
HCT: 44.6
MCV: 88.8
Platelets: 248
WBC: 8.2

## 2011-07-25 LAB — URINALYSIS, ROUTINE W REFLEX MICROSCOPIC
Bilirubin Urine: NEGATIVE
Specific Gravity, Urine: 1.025
pH: 5.5

## 2011-07-25 LAB — ETHANOL: Alcohol, Ethyl (B): <5

## 2011-07-31 LAB — BASIC METABOLIC PANEL
BUN: 6 mg/dL (ref 6–23)
CO2: 23 mEq/L (ref 19–32)
Calcium: 9.1 mg/dL (ref 8.4–10.5)
Creatinine, Ser: 0.77 mg/dL (ref 0.4–1.5)
GFR calc Af Amer: >60 mL/min (ref >60)
Glucose, Bld: 101 mg/dL — ABNORMAL HIGH (ref 70–99)

## 2011-07-31 LAB — DIFFERENTIAL
Basophils Absolute: 0.1 10*3/uL (ref 0.0–0.1)
Basophils Relative: 1 % (ref 0–1)
Neutro Abs: 8.3 10*3/uL — ABNORMAL HIGH (ref 1.7–7.7)
Neutrophils Relative %: 67 % (ref 43–77)

## 2011-07-31 LAB — CBC
MCHC: 33.4 g/dL (ref 30.0–36.0)
Platelets: 216 10*3/uL (ref 150–400)
RBC: 4.6 MIL/uL (ref 4.22–5.81)
RDW: 12.4 % (ref 11.5–15.5)

## 2011-07-31 LAB — CULTURE, BLOOD (ROUTINE X 2): Culture: NO GROWTH

## 2011-08-11 LAB — DIFFERENTIAL
Eosinophils Absolute: 0.1
Lymphocytes Relative: 16
Lymphs Abs: 1.9
Monocytes Relative: 7
Neutrophils Relative %: 77

## 2011-08-11 LAB — CULTURE, BLOOD (ROUTINE X 2)
Culture: NO GROWTH
Report Status: 8142008

## 2011-08-11 LAB — BASIC METABOLIC PANEL
Chloride: 103
Creatinine, Ser: 0.61
GFR calc Af Amer: >60
GFR calc non Af Amer: >60
Potassium: 2.7 — CL

## 2011-08-11 LAB — BLOOD GAS, ARTERIAL
Bicarbonate: 25.3 — ABNORMAL HIGH
FIO2: 0.21
pH, Arterial: 7.512 — ABNORMAL HIGH
pO2, Arterial: 94.8

## 2011-08-11 LAB — CBC
MCV: 88.1
Platelets: 320
RBC: 4.75
WBC: 12.3 — ABNORMAL HIGH

## 2011-08-13 LAB — CBC
MCHC: 33
MCV: 89.6
Platelets: 213
RDW: 12.7

## 2011-08-13 LAB — DIFFERENTIAL
Basophils Absolute: 0
Basophils Relative: 0
Blasts: 0
Lymphocytes Relative: 13
Lymphs Abs: 1.9
Myelocytes: 0
Neutro Abs: 11.6 — ABNORMAL HIGH
Neutrophils Relative %: 80 — ABNORMAL HIGH
Promyelocytes Absolute: 0

## 2011-08-13 LAB — CULTURE, BLOOD (ROUTINE X 2)
Culture: NO GROWTH
Culture: NO GROWTH
Report Status: 6282008
Report Status: 6282008

## 2011-08-13 LAB — BASIC METABOLIC PANEL
BUN: 5 — ABNORMAL LOW
CO2: 22
Calcium: 9
Chloride: 105
Creatinine, Ser: 0.84
GFR calc Af Amer: >60
Glucose, Bld: 125 — ABNORMAL HIGH

## 2011-08-14 LAB — CBC
HCT: 38.2 — ABNORMAL LOW
Hemoglobin: 13.3
MCHC: 34.7
Platelets: 127 — ABNORMAL LOW
RDW: 13.4

## 2011-08-14 LAB — DIFFERENTIAL
Basophils Absolute: 0
Basophils Relative: 0
Eosinophils Relative: 3
Monocytes Absolute: 0.7

## 2016-02-09 ENCOUNTER — Encounter (HOSPITAL_COMMUNITY): Payer: Self-pay

## 2016-02-09 ENCOUNTER — Emergency Department (HOSPITAL_COMMUNITY)
Admission: EM | Admit: 2016-02-09 | Discharge: 2016-02-10 | Disposition: A | Discharge status: Home / Self Care | Payer: Self-pay | Attending: Emergency Medicine | Admitting: Emergency Medicine

## 2016-02-09 ENCOUNTER — Emergency Department (HOSPITAL_COMMUNITY): Payer: Self-pay

## 2016-02-09 DIAGNOSIS — S60551A Superficial foreign body of right hand, initial encounter: Secondary | ICD-10-CM | POA: Insufficient documentation

## 2016-02-09 DIAGNOSIS — Y929 Unspecified place or not applicable: Secondary | ICD-10-CM | POA: Insufficient documentation

## 2016-02-09 DIAGNOSIS — Y9389 Activity, other specified: Secondary | ICD-10-CM | POA: Insufficient documentation

## 2016-02-09 DIAGNOSIS — Y990 Civilian activity done for income or pay: Secondary | ICD-10-CM | POA: Insufficient documentation

## 2016-02-09 DIAGNOSIS — Z9889 Other specified postprocedural states: Secondary | ICD-10-CM | POA: Insufficient documentation

## 2016-02-09 DIAGNOSIS — Z79899 Other long term (current) drug therapy: Secondary | ICD-10-CM | POA: Insufficient documentation

## 2016-02-09 DIAGNOSIS — W268XXA Contact with other sharp object(s), not elsewhere classified, initial encounter: Secondary | ICD-10-CM | POA: Insufficient documentation

## 2016-02-09 DIAGNOSIS — F17200 Nicotine dependence, unspecified, uncomplicated: Secondary | ICD-10-CM | POA: Insufficient documentation

## 2016-02-09 LAB — CBC WITH DIFFERENTIAL/PLATELET
BASOS ABS: 0 10*3/uL (ref 0.0–0.1)
Basophils Relative: 0 %
Eosinophils Absolute: 0.4 10*3/uL (ref 0.0–0.7)
Eosinophils Relative: 4 %
HEMATOCRIT: 41.9 % (ref 39.0–52.0)
HEMOGLOBIN: 14 g/dL (ref 13.0–17.0)
LYMPHS ABS: 2.3 10*3/uL (ref 0.7–4.0)
LYMPHS PCT: 25 %
MCH: 30 pg (ref 26.0–34.0)
MCHC: 33.4 g/dL (ref 30.0–36.0)
MCV: 89.9 fL (ref 78.0–100.0)
Monocytes Absolute: 0.8 10*3/uL (ref 0.1–1.0)
Monocytes Relative: 9 %
NEUTROS ABS: 5.6 10*3/uL (ref 1.7–7.7)
Neutrophils Relative %: 62 %
Platelets: 179 10*3/uL (ref 150–400)
RBC: 4.66 MIL/uL (ref 4.22–5.81)
RDW: 12.9 % (ref 11.5–15.5)
WBC: 9.1 10*3/uL (ref 4.0–10.5)

## 2016-02-09 LAB — BASIC METABOLIC PANEL
Anion gap: 7 (ref 5–15)
BUN: 9 mg/dL (ref 6–20)
CALCIUM: 8.5 mg/dL — AB (ref 8.9–10.3)
CHLORIDE: 104 mmol/L (ref 101–111)
CO2: 24 mmol/L (ref 22–32)
CREATININE: 0.86 mg/dL (ref 0.61–1.24)
Glucose, Bld: 87 mg/dL (ref 65–99)
Potassium: 3.3 mmol/L — ABNORMAL LOW (ref 3.5–5.1)
SODIUM: 135 mmol/L (ref 135–145)

## 2016-02-09 NOTE — ED Notes (Signed)
I had a piece of metal in my right hand and I got it out.  I woke up yesterday with my hand swelling. Having pain in my right hand.
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
## 2016-02-10 MED ORDER — SULFAMETHOXAZOLE-TRIMETHOPRIM 800-160 MG PO TABS: 1.0000 | ORAL_TABLET | Freq: Two times a day (BID) | ORAL | Status: AC

## 2016-02-10 MED ORDER — DOXYCYCLINE HYCLATE 100 MG IV SOLR
100.0000 mg | Freq: Two times a day (BID) | INTRAVENOUS | Status: DC
  Administered 2016-02-10: 100 mg via INTRAVENOUS
  Filled 2016-02-10 (×4): qty 100

## 2016-02-10 MED ORDER — DOXYCYCLINE HYCLATE 100 MG IV SOLR
INTRAVENOUS | Status: AC
  Filled 2016-02-10: qty 100

## 2016-02-10 MED ORDER — HYDROCODONE-ACETAMINOPHEN 5-325 MG PO TABS
1.0000 | ORAL_TABLET | ORAL | Status: DC | PRN
Start: 2016-02-10 — End: 2016-08-17

## 2016-02-10 NOTE — ED Provider Notes (Signed)
By signing my name below, I, Budd PalmerVanessa Prueter, attest that this documentation has been prepared under the direction and in the presence of Devoria AlbeIva Starlet Gallentine, MD. Electronically Signed: Budd PalmerVanessa Prueter, ED Scribe. 02/10/2016. 12:25 AM.  HPI Comments: Shane [name] is a 40 y.o. male who presents to the Emergency Department complaining of right hand pain onset 2 days ago. He reports associated swelling. 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 He reports he was able to remove the metal, but noticed the swelling to the hand the next day. He states the pain and swelling have worsened since then, which is why he came to the ED today.    Physical Exam: moderate swelling around the MCP joints of the R middle finger, intact extension. Ha less swelling of the ulnar aspect of the dorsum of his hand.        DIAGNOSTIC STUDIES: Oxygen Saturation is 100% on RA, normal by my interpretation.    COORDINATION OF CARE: 12:23 AM - Discussed imaging results and plans to wait on hand surgeon's recommendations on how to proceed. Pt advised of plan for treatment and pt agrees.  Medical screening examination/treatment/procedure(s) were conducted as a shared visit with non-physician practitioner(s) and myself.  I personally evaluated the patient during the encounter.   Devoria AlbeIva Erskine Steinfeldt, MD, FACEP   I personally performed the services described in this documentation, which was scribed in my presence. The recorded information has been reviewed and considered.  Devoria AlbeIva Marlys Stegmaier, MD, Concha PyoFACEP    Alisyn Lequire, MD 02/10/16 [phone]

## 2016-02-10 NOTE — Discharge Instructions (Signed)
Continue to take the ibuprofen. Do not take the narcotic if driving as it will make you sleepy.  If you see more swelling, redness, red streaking, develop fever or other problems [facility] and tell them that you were here and we spoke with Dr. Merlyn LotKuzma and he wanted you to come there if your symptoms worsen.

## 2016-04-30 ENCOUNTER — Encounter (HOSPITAL_COMMUNITY): Payer: Self-pay | Admitting: Emergency Medicine

## 2016-04-30 ENCOUNTER — Emergency Department (HOSPITAL_COMMUNITY)
Admission: EM | Admit: 2016-04-30 | Discharge: 2016-04-30 | Disposition: A | Discharge status: Home / Self Care | Payer: Self-pay | Attending: Emergency Medicine | Admitting: Emergency Medicine

## 2016-04-30 DIAGNOSIS — L230 Allergic contact dermatitis due to metals: Secondary | ICD-10-CM | POA: Insufficient documentation

## 2016-04-30 DIAGNOSIS — F17200 Nicotine dependence, unspecified, uncomplicated: Secondary | ICD-10-CM | POA: Insufficient documentation

## 2016-04-30 DIAGNOSIS — L02411 Cutaneous abscess of right axilla: Secondary | ICD-10-CM | POA: Insufficient documentation

## 2016-04-30 HISTORY — DX: Carrier or suspected carrier of methicillin resistant Staphylococcus aureus: Z22.322

## 2016-04-30 MED ORDER — SULFAMETHOXAZOLE-TRIMETHOPRIM 800-160 MG PO TABS
1.0000 | ORAL_TABLET | Freq: Once | ORAL | Status: AC
  Administered 2016-04-30: 1 via ORAL
  Filled 2016-04-30: qty 1

## 2016-04-30 MED ORDER — SULFAMETHOXAZOLE-TRIMETHOPRIM 800-160 MG PO TABS: 1.0000 | ORAL_TABLET | Freq: Two times a day (BID) | ORAL | Status: AC

## 2016-04-30 MED ORDER — TRIAMCINOLONE ACETONIDE 0.1 % EX CREA: 1.0000 "application " | TOPICAL_CREAM | Freq: Three times a day (TID) | CUTANEOUS | Status: DC

## 2016-04-30 NOTE — Discharge Instructions (Signed)
Abscess °An abscess (boil or furuncle) is an infected area on or under the skin. This area is filled with yellowish-white fluid (pus) and other material (debris). °HOME CARE  °· Only take medicines as told by your doctor. °· If you were given antibiotic medicine, take it as directed. Finish the medicine even if you start to feel better. °· If gauze is used, follow your doctor's directions for changing the gauze. °· To avoid spreading the infection: °¨ Keep your abscess covered with a bandage. °¨ Wash your hands well. °¨ Do not share personal care items, towels, or whirlpools with others. °¨ Avoid skin contact with others. °· Keep your skin and clothes clean around the abscess. °· Keep all doctor visits as told. °GET HELP RIGHT AWAY IF:  °· You have more pain, puffiness (swelling), or redness in the wound site. °· You have more fluid or blood coming from the wound site. °· You have muscle aches, chills, or you feel sick. °· You have a fever. °MAKE SURE YOU:  °· Understand these instructions. °· Will watch your condition. °· Will get help right away if you are not doing well or get worse. °  °This information is not intended to replace advice given to you by your health care provider. Make sure you discuss any questions you have with your health care provider. °  °Document Released: 03/31/2008 Document Revised: 04/13/2012 Document Reviewed: 12/27/2011 °Elsevier Interactive Patient Education ©2016 [employer]. ° °

## 2016-04-30 NOTE — ED Notes (Signed)
Patient complaining of abscess to right axilla area.
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
## 2016-08-17 ENCOUNTER — Emergency Department (HOSPITAL_COMMUNITY)
Admission: EM | Admit: 2016-08-17 | Discharge: 2016-08-17 | Disposition: A | Discharge status: Home / Self Care | Payer: Self-pay | Attending: Emergency Medicine | Admitting: Emergency Medicine

## 2016-08-17 ENCOUNTER — Encounter (HOSPITAL_COMMUNITY): Payer: Self-pay

## 2016-08-17 ENCOUNTER — Emergency Department (HOSPITAL_COMMUNITY): Payer: Self-pay

## 2016-08-17 DIAGNOSIS — Y999 Unspecified external cause status: Secondary | ICD-10-CM | POA: Insufficient documentation

## 2016-08-17 DIAGNOSIS — Y939 Activity, unspecified: Secondary | ICD-10-CM | POA: Insufficient documentation

## 2016-08-17 DIAGNOSIS — F17210 Nicotine dependence, cigarettes, uncomplicated: Secondary | ICD-10-CM | POA: Insufficient documentation

## 2016-08-17 DIAGNOSIS — Z79899 Other long term (current) drug therapy: Secondary | ICD-10-CM | POA: Insufficient documentation

## 2016-08-17 DIAGNOSIS — W57XXXA Bitten or stung by nonvenomous insect and other nonvenomous arthropods, initial encounter: Secondary | ICD-10-CM | POA: Insufficient documentation

## 2016-08-17 DIAGNOSIS — Y929 Unspecified place or not applicable: Secondary | ICD-10-CM | POA: Insufficient documentation

## 2016-08-17 DIAGNOSIS — L03114 Cellulitis of left upper limb: Secondary | ICD-10-CM | POA: Insufficient documentation

## 2016-08-17 DIAGNOSIS — L03119 Cellulitis of unspecified part of limb: Secondary | ICD-10-CM

## 2016-08-17 LAB — CBC WITH DIFFERENTIAL/PLATELET
BASOS ABS: 0 10*3/uL (ref 0.0–0.1)
BASOS PCT: 0 %
EOS ABS: 0.3 10*3/uL (ref 0.0–0.7)
Eosinophils Relative: 2 %
HCT: 46.2 % (ref 39.0–52.0)
HEMOGLOBIN: 15.7 g/dL (ref 13.0–17.0)
LYMPHS ABS: 2 10*3/uL (ref 0.7–4.0)
Lymphocytes Relative: 14 %
MCH: 31.1 pg (ref 26.0–34.0)
MCHC: 34 g/dL (ref 30.0–36.0)
MCV: 91.5 fL (ref 78.0–100.0)
Monocytes Absolute: 1.3 10*3/uL — ABNORMAL HIGH (ref 0.1–1.0)
Monocytes Relative: 9 %
NEUTROS PCT: 75 %
Neutro Abs: 11 10*3/uL — ABNORMAL HIGH (ref 1.7–7.7)
PLATELETS: 187 10*3/uL (ref 150–400)
RBC: 5.05 MIL/uL (ref 4.22–5.81)
RDW: 12.7 % (ref 11.5–15.5)
WBC: 14.6 10*3/uL — AB (ref 4.0–10.5)

## 2016-08-17 LAB — BASIC METABOLIC PANEL
Anion gap: 6 (ref 5–15)
BUN: 10 mg/dL (ref 6–20)
CALCIUM: 9.4 mg/dL (ref 8.9–10.3)
CO2: 24 mmol/L (ref 22–32)
CREATININE: 0.91 mg/dL (ref 0.61–1.24)
Chloride: 108 mmol/L (ref 101–111)
GFR calc Af Amer: >60 mL/min (ref >60)
GLUCOSE: 70 mg/dL (ref 65–99)
Potassium: 3.9 mmol/L (ref 3.5–5.1)
Sodium: 138 mmol/L (ref 135–145)

## 2016-08-17 MED ORDER — OXYCODONE-ACETAMINOPHEN 5-325 MG PO TABS
1.0000 | ORAL_TABLET | Freq: Once | ORAL | Status: AC
  Administered 2016-08-17: 1 via ORAL
  Filled 2016-08-17: qty 1

## 2016-08-17 MED ORDER — CLINDAMYCIN HCL 150 MG PO CAPS: 300.0000 mg | ORAL_CAPSULE | Freq: Four times a day (QID) | ORAL | 0 refills | Status: AC

## 2016-08-17 MED ORDER — HYDROCODONE-ACETAMINOPHEN 5-325 MG PO TABS: 1.0000 | ORAL_TABLET | Freq: Four times a day (QID) | ORAL | 0 refills | Status: DC | PRN

## 2016-08-17 MED ORDER — CLINDAMYCIN HCL 150 MG PO CAPS
300.0000 mg | ORAL_CAPSULE | Freq: Once | ORAL | Status: AC
  Administered 2016-08-17: 300 mg via ORAL
  Filled 2016-08-17: qty 2

## 2016-08-17 NOTE — ED Triage Notes (Signed)
Pt reports that he has insect bite to left hand possibly friday Increased swelling and redness worsening last night.

## 2016-08-17 NOTE — Discharge Instructions (Signed)
Please  call Dr. Romeo AppleHarrison tomorrow morning to set up a close follow-up appointment. Take antibiotics as prescribed. Return without fail for worsening symptoms, including fever, increased redness or swelling, intractable vomiting, or any other symptoms concerning to.

## 2016-08-17 NOTE — ED Notes (Signed)
Returned from xray
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
## 2016-08-18 ENCOUNTER — Encounter (HOSPITAL_COMMUNITY)
Admission: RE | Admit: 2016-08-18 | Discharge: 2016-08-18 | Disposition: A | Discharge status: Home / Self Care | Payer: Self-pay | Source: Ambulatory Visit | Attending: Orthopedic Surgery | Admitting: Orthopedic Surgery

## 2016-08-18 ENCOUNTER — Encounter (HOSPITAL_COMMUNITY): Payer: Self-pay

## 2016-08-18 ENCOUNTER — Ambulatory Visit (INDEPENDENT_AMBULATORY_CARE_PROVIDER_SITE_OTHER): Payer: Self-pay | Admitting: Orthopedic Surgery

## 2016-08-18 VITALS — BP 141/75 | HR 69 | Ht 71.0 in | Wt 180.0 lb

## 2016-08-18 DIAGNOSIS — L02512 Cutaneous abscess of left hand: Secondary | ICD-10-CM

## 2016-08-18 HISTORY — DX: Gastro-esophageal reflux disease without esophagitis: K21.9

## 2016-08-18 NOTE — Addendum Note (Signed)
Addended by: Adella HareBOOTHE, Mahrukh Seguin B on: 08/18/2016 11:19 AM   Modules accepted: Orders, SmartSet
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
## 2016-08-18 NOTE — Patient Instructions (Signed)
Patient scheduled for surgery for Tuesday at noon

## 2016-08-19 ENCOUNTER — Ambulatory Visit (HOSPITAL_COMMUNITY): Payer: Self-pay | Admitting: Anesthesiology

## 2016-08-19 ENCOUNTER — Ambulatory Visit (HOSPITAL_COMMUNITY)
Admission: RE | Admit: 2016-08-19 | Discharge: 2016-08-19 | Disposition: A | Discharge status: Home / Self Care | Payer: Self-pay | Source: Ambulatory Visit | Attending: Orthopedic Surgery | Admitting: Orthopedic Surgery

## 2016-08-19 ENCOUNTER — Encounter (HOSPITAL_COMMUNITY)
Admission: RE | Disposition: A | Discharge status: Home / Self Care | Payer: Self-pay | Source: Ambulatory Visit | Attending: Orthopedic Surgery

## 2016-08-19 ENCOUNTER — Encounter (HOSPITAL_COMMUNITY): Payer: Self-pay

## 2016-08-19 DIAGNOSIS — Z8614 Personal history of Methicillin resistant Staphylococcus aureus infection: Secondary | ICD-10-CM | POA: Insufficient documentation

## 2016-08-19 DIAGNOSIS — L02512 Cutaneous abscess of left hand: Secondary | ICD-10-CM | POA: Insufficient documentation

## 2016-08-19 DIAGNOSIS — F1290 Cannabis use, unspecified, uncomplicated: Secondary | ICD-10-CM | POA: Insufficient documentation

## 2016-08-19 DIAGNOSIS — F17210 Nicotine dependence, cigarettes, uncomplicated: Secondary | ICD-10-CM | POA: Insufficient documentation

## 2016-08-19 DIAGNOSIS — Z881 Allergy status to other antibiotic agents status: Secondary | ICD-10-CM | POA: Insufficient documentation

## 2016-08-19 DIAGNOSIS — K219 Gastro-esophageal reflux disease without esophagitis: Secondary | ICD-10-CM | POA: Insufficient documentation

## 2016-08-19 DIAGNOSIS — Z880 Allergy status to penicillin: Secondary | ICD-10-CM | POA: Insufficient documentation

## 2016-08-19 HISTORY — PX: INCISION AND DRAINAGE ABSCESS: SHX5864

## 2016-08-19 LAB — SURGICAL PCR SCREEN
MRSA, PCR: NEGATIVE
Staphylococcus aureus: NEGATIVE

## 2016-08-19 SURGERY — INCISION AND DRAINAGE, ABSCESS
Anesthesia: General | Site: Thumb | Laterality: Left

## 2016-08-19 MED ORDER — SUCCINYLCHOLINE CHLORIDE 20 MG/ML IJ SOLN
INTRAMUSCULAR | Status: AC
  Filled 2016-08-19: qty 1

## 2016-08-19 MED ORDER — SEVOFLURANE IN SOLN
RESPIRATORY_TRACT | Status: AC
  Filled 2016-08-19: qty 250

## 2016-08-19 MED ORDER — SODIUM CHLORIDE 0.9 % IR SOLN
Status: DC | PRN
  Administered 2016-08-19: 2000 mL

## 2016-08-19 MED ORDER — MUPIROCIN 2 % EX OINT
TOPICAL_OINTMENT | CUTANEOUS | Status: AC
  Filled 2016-08-19: qty 22

## 2016-08-19 MED ORDER — MIDAZOLAM HCL 2 MG/2ML IJ SOLN
INTRAMUSCULAR | Status: AC
  Filled 2016-08-19: qty 2

## 2016-08-19 MED ORDER — SUCCINYLCHOLINE CHLORIDE 20 MG/ML IJ SOLN
INTRAMUSCULAR | Status: DC | PRN
  Administered 2016-08-19: 175 mg via INTRAVENOUS

## 2016-08-19 MED ORDER — PROPOFOL 10 MG/ML IV BOLUS
INTRAVENOUS | Status: AC
  Filled 2016-08-19: qty 20

## 2016-08-19 MED ORDER — HYDROMORPHONE HCL 1 MG/ML IJ SOLN
0.2500 mg | INTRAMUSCULAR | Status: DC | PRN
  Administered 2016-08-19: 0.5 mg via INTRAVENOUS
  Filled 2016-08-19: qty 0.5

## 2016-08-19 MED ORDER — BUPIVACAINE HCL (PF) 0.5 % IJ SOLN
INTRAMUSCULAR | Status: AC
  Filled 2016-08-19: qty 30

## 2016-08-19 MED ORDER — OXYCODONE-ACETAMINOPHEN 5-325 MG PO TABS: 1.0000 | ORAL_TABLET | ORAL | 0 refills | Status: DC | PRN

## 2016-08-19 MED ORDER — BUPIVACAINE HCL (PF) 0.5 % IJ SOLN
INTRAMUSCULAR | Status: DC | PRN
  Administered 2016-08-19: 20 mL

## 2016-08-19 MED ORDER — MIDAZOLAM HCL 2 MG/2ML IJ SOLN
1.0000 mg | INTRAMUSCULAR | Status: DC | PRN
  Administered 2016-08-19 (×2): 2 mg via INTRAVENOUS
  Filled 2016-08-19: qty 2

## 2016-08-19 MED ORDER — ONDANSETRON HCL 4 MG/2ML IJ SOLN
4.0000 mg | Freq: Once | INTRAMUSCULAR | Status: AC
  Administered 2016-08-19: 4 mg via INTRAVENOUS

## 2016-08-19 MED ORDER — FENTANYL CITRATE (PF) 250 MCG/5ML IJ SOLN
INTRAMUSCULAR | Status: AC
  Filled 2016-08-19: qty 5

## 2016-08-19 MED ORDER — LIDOCAINE HCL (PF) 1 % IJ SOLN
INTRAMUSCULAR | Status: AC
  Filled 2016-08-19: qty 5

## 2016-08-19 MED ORDER — ONDANSETRON HCL 4 MG/2ML IJ SOLN
INTRAMUSCULAR | Status: AC
  Filled 2016-08-19: qty 2

## 2016-08-19 MED ORDER — CHLORHEXIDINE GLUCONATE 4 % EX LIQD: 60.0000 mL | Freq: Once | CUTANEOUS | Status: DC

## 2016-08-19 MED ORDER — PROPOFOL 10 MG/ML IV BOLUS
INTRAVENOUS | Status: DC | PRN
  Administered 2016-08-19: 200 mg via INTRAVENOUS

## 2016-08-19 MED ORDER — ROCURONIUM BROMIDE 100 MG/10ML IV SOLN
INTRAVENOUS | Status: DC | PRN
  Administered 2016-08-19: 5 mg via INTRAVENOUS

## 2016-08-19 MED ORDER — MIDAZOLAM HCL 5 MG/5ML IJ SOLN
INTRAMUSCULAR | Status: DC | PRN
  Administered 2016-08-19: 2 mg via INTRAVENOUS

## 2016-08-19 MED ORDER — LACTATED RINGERS IV SOLN
INTRAVENOUS | Status: DC
  Administered 2016-08-19: 50 mL/h via INTRAVENOUS

## 2016-08-19 MED ORDER — FENTANYL CITRATE (PF) 100 MCG/2ML IJ SOLN
25.0000 ug | INTRAMUSCULAR | Status: AC | PRN
  Administered 2016-08-19 (×2): 25 ug via INTRAVENOUS

## 2016-08-19 MED ORDER — LIDOCAINE HCL 1 % IJ SOLN
INTRAMUSCULAR | Status: DC | PRN
  Administered 2016-08-19: 30 mg via INTRADERMAL

## 2016-08-19 MED ORDER — FENTANYL CITRATE (PF) 100 MCG/2ML IJ SOLN
INTRAMUSCULAR | Status: DC | PRN
  Administered 2016-08-19: 100 ug via INTRAVENOUS
  Administered 2016-08-19: 50 ug via INTRAVENOUS

## 2016-08-19 MED ORDER — MUPIROCIN 2 % EX OINT
1.0000 "application " | TOPICAL_OINTMENT | Freq: Once | CUTANEOUS | Status: AC
  Administered 2016-08-19: 1 via TOPICAL

## 2016-08-19 MED ORDER — ROCURONIUM BROMIDE 50 MG/5ML IV SOLN
INTRAVENOUS | Status: AC
  Filled 2016-08-19: qty 1

## 2016-08-19 MED ORDER — FENTANYL CITRATE (PF) 100 MCG/2ML IJ SOLN
INTRAMUSCULAR | Status: AC
  Filled 2016-08-19: qty 2

## 2016-08-19 SURGICAL SUPPLY — 32 items
BAG HAMPER (MISCELLANEOUS) ×3 IMPLANT
BANDAGE ELASTIC 2 LF NS (GAUZE/BANDAGES/DRESSINGS) ×2 IMPLANT
BANDAGE ESMARK 4X12 BL STRL LF (DISPOSABLE) ×1 IMPLANT
BNDG CMPR 12X4 ELC STRL LF (DISPOSABLE) ×1
BNDG CMPR MED 5X2 ELC HKLP NS (GAUZE/BANDAGES/DRESSINGS) ×1
BNDG CONFORM 2 STRL LF (GAUZE/BANDAGES/DRESSINGS) ×3 IMPLANT
BNDG ESMARK 4X12 BLUE STRL LF (DISPOSABLE) ×3
CLOTH BEACON ORANGE TIMEOUT ST (SAFETY) ×3 IMPLANT
COVER LIGHT HANDLE STERIS (MISCELLANEOUS) ×6 IMPLANT
DRSG XEROFORM 1X8 (GAUZE/BANDAGES/DRESSINGS) ×2 IMPLANT
ELECT REM PT RETURN 9FT ADLT (ELECTROSURGICAL) ×3
ELECTRODE REM PT RTRN 9FT ADLT (ELECTROSURGICAL) ×1 IMPLANT
GAUZE SPONGE 4X4 12PLY STRL (GAUZE/BANDAGES/DRESSINGS) ×2 IMPLANT
GLOVE BIOGEL PI IND STRL 7.0 (GLOVE) ×1 IMPLANT
GLOVE BIOGEL PI INDICATOR 7.0 (GLOVE) ×2
GLOVE OPTIFIT SS 8.0 STRL (GLOVE) ×3 IMPLANT
GLOVE SKINSENSE NS SZ8.0 LF (GLOVE) ×2
GLOVE SKINSENSE STRL SZ8.0 LF (GLOVE) ×1 IMPLANT
GOWN STRL REUS W/TWL LRG LVL3 (GOWN DISPOSABLE) ×12 IMPLANT
GOWN STRL REUS W/TWL XL LVL3 (GOWN DISPOSABLE) ×3 IMPLANT
INST SET MINOR BONE (KITS) ×3 IMPLANT
KIT ROOM TURNOVER APOR (KITS) ×3 IMPLANT
MANIFOLD NEPTUNE II (INSTRUMENTS) ×3 IMPLANT
MARKER SKIN DUAL TIP RULER LAB (MISCELLANEOUS) ×3 IMPLANT
NS IRRIG 1000ML POUR BTL (IV SOLUTION) ×5 IMPLANT
PACK BASIC LIMB (CUSTOM PROCEDURE TRAY) ×2 IMPLANT
PAD ARMBOARD 7.5X6 YLW CONV (MISCELLANEOUS) ×3 IMPLANT
SET BASIN LINEN APH (SET/KITS/TRAYS/PACK) ×3 IMPLANT
SUT ETHILON 3 0 FSL (SUTURE) ×2 IMPLANT
SWAB CULTURE ESWAB REG 1ML (MISCELLANEOUS) ×2 IMPLANT
SWAB CULTURE LIQ STUART DBL (MISCELLANEOUS) ×2 IMPLANT
SYR BULB IRRIGATION 50ML (SYRINGE) ×3 IMPLANT

## 2016-08-19 NOTE — Brief Op Note (Signed)
08/19/2016  12:59 PM  PATIENT:  Shane [name]  40 y.o. male  PRE-OPERATIVE DIAGNOSIS:  LEFT THUMB INFECTION/abscess  POST-OPERATIVE DIAGNOSIS:  LEFT THUMB INFECTION/abscess  PROCEDURE:  Procedure(s) with comments: INCISION AND DRAINAGE ABSCESS (Left) - LEFT THUMB   Abscess left thumb subcutaneous, down to but not into the metacarpophalangeal joint, 5 mL of purulent material no tissue necrosis  Details of procedure  In the preop area the patient was identified in the surgical site was marked. Chart review was completed. He was taken to the operating room for general anesthesia. He was prepped and draped with Betadine. After sterile draping timeout was completed.  A made a longitudinal incision at the point of maximum fluctuance and immediate purulent material was drained approximate 5 mL. Anaerobic and aerobic cultures were obtained  The wound was explored with finger dissection. I did used to tourniquet because I wanted to determine if the fluid went into the joint and it did not. I move the thumb at the metacarpophalangeal joint pressed it and no purulent material was expressed  20 mL of plain Marcaine quarter percent was injected around the wound and a digital radial nerve block was also done with the Marcaine  All the purulence was in the subtenons tissue. I irrigated with a liter saline and then packed with a wet saline 4 x 4 gauze which was cut to fit the wound and then a sterile dressing was placed over that.  The tourniquet was let down the fingers have good color    SURGEON:  Surgeon(s) and Role:    * Vickki HearingStanley E Diannie Willner, MD - Primary  PHYSICIAN ASSISTANT:   ASSISTANTS: none   ANESTHESIA:   general  EBL:  Total I/O In: 700 [I.V.:700] Out: 10 [Blood:10]  BLOOD ADMINISTERED:none  DRAINS: none   LOCAL MEDICATIONS USED:  MARCAINE     SPECIMEN:  No Specimen  DISPOSITION OF SPECIMEN:  N/A     COUNTS:  YES  TOURNIQUET:  * Missing tourniquet times found for  documented tourniquets in log:  [id number] *  DICTATION: .Dragon Dictation  PLAN OF CARE: Discharge to home after PACU  PATIENT DISPOSITION:  PACU - hemodynamically stable.   Delay start of Pharmacological VTE agent (>24hrs) due to surgical blood loss or risk of bleeding: not applicable

## 2016-08-19 NOTE — Anesthesia Procedure Notes (Signed)
Procedure Name: Intubation Date/Time: 08/19/2016 12:26 PM Performed by: Shane [name], Shane Montecalvo J Pre-anesthesia Checklist: Patient identified, Patient being monitored, Timeout performed, Emergency Drugs available and Suction available Patient Re-evaluated:Patient Re-evaluated prior to inductionOxygen Delivery Method: Circle System Utilized Preoxygenation: Pre-oxygenation with 100% oxygen Intubation Type: IV induction, Rapid sequence and Cricoid Pressure applied Ventilation: Mask ventilation without difficulty Laryngoscope Size: Mac and 3 Grade View: Grade I Tube type: Oral Tube size: 8.0 mm Number of attempts: 1 Airway Equipment and Method: stylet Placement Confirmation: ETT inserted through vocal cords under direct vision,  positive ETCO2 and breath sounds checked- equal and bilateral Secured at: 22 cm Tube secured with: Tape Dental Injury: Teeth and Oropharynx as per pre-operative assessment
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
## 2016-08-19 NOTE — Anesthesia Preprocedure Evaluation (Addendum)
Anesthesia Evaluation  Patient identified by MRN, date of birth, ID band Patient awake    Reviewed: Allergy & Precautions, NPO status , Unable to perform ROS - Chart review only  Airway Mallampati: I  TM Distance: >3 FB     Dental  (+) Edentulous Upper, Edentulous Lower   Pulmonary Current Smoker,    breath sounds clear to auscultation       Cardiovascular negative cardio ROS   Rhythm:Regular Rate:Normal     Neuro/Psych    GI/Hepatic GERD  Medicated and Poorly Controlled,(+)     substance abuse  marijuana use,   Endo/Other    Renal/GU      Musculoskeletal   Abdominal   Peds  Hematology   Anesthesia Other Findings   Reproductive/Obstetrics                            Anesthesia Physical Anesthesia Plan  ASA: II  Anesthesia Plan: General   Post-op Pain Management:    Induction: Intravenous, Rapid sequence and Cricoid pressure planned  Airway Management Planned: Oral ETT  Additional Equipment:   Intra-op Plan:   Post-operative Plan: Extubation in OR  Informed Consent: I have reviewed the patients History and Physical, chart, labs and discussed the procedure including the risks, benefits and alternatives for the proposed anesthesia with the patient or authorized representative who has indicated his/her understanding and acceptance.     Plan Discussed with:   Anesthesia Plan Comments:         Anesthesia Quick Evaluation

## 2016-08-19 NOTE — Transfer of Care (Signed)
Immediate Anesthesia Transfer of Care Note  Patient: Shane [name]  Procedure(s) Performed: Procedure(s) with comments: INCISION AND DRAINAGE ABSCESS (Left) - LEFT THUMB  Patient Location: PACU  Anesthesia Type:General  Level of Consciousness: awake and patient cooperative  Airway & Oxygen Therapy: Patient Spontanous Breathing and Patient connected to face mask oxygen  Post-op Assessment: Report given to RN, Post -op Vital signs reviewed and stable and Patient moving all extremities  Post vital signs: Reviewed and stable  Last Vitals:  Vitals:   08/19/16 1205 08/19/16 1210  BP: (!) 141/77 140/83  Pulse:    Resp: 12 (!) 29  Temp:      Last Pain:  Vitals:   08/19/16 1121  TempSrc: Oral  PainSc: 7       Patients Stated Pain Goal: 3 (08/19/16 1121)  Complications: No apparent anesthesia complications

## 2016-08-19 NOTE — Anesthesia Postprocedure Evaluation (Signed)
Anesthesia Post Note  Patient: Shane [name]  Procedure(s) Performed: Procedure(s) (LRB): INCISION AND DRAINAGE ABSCESS (Left)  Patient location during evaluation: PACU Anesthesia Type: General Level of consciousness: awake and alert, oriented and patient cooperative Pain management: pain level controlled Vital Signs Assessment: post-procedure vital signs reviewed and stable Respiratory status: spontaneous breathing, nonlabored ventilation and respiratory function stable Cardiovascular status: blood pressure returned to baseline and stable Postop Assessment: no signs of nausea or vomiting Anesthetic complications: no    Last Vitals:  Vitals:   08/19/16 1210 08/19/16 1315  BP: 140/83 (!) 135/99  Pulse:    Resp: (!) 29   Temp:  36.6 C    Last Pain:  Vitals:   08/19/16 1315  TempSrc:   PainSc: 0-No pain                 Anacristina Steffek J

## 2016-08-19 NOTE — Op Note (Signed)
08/19/2016  12:59 PM  PATIENT:  Shane [name]  40 y.o. male  PRE-OPERATIVE DIAGNOSIS:  LEFT THUMB INFECTION/abscess  POST-OPERATIVE DIAGNOSIS:  LEFT THUMB INFECTION/abscess  PROCEDURE:  Procedure(s) with comments: INCISION AND DRAINAGE ABSCESS (Left) - LEFT THUMB   Abscess left thumb subcutaneous, down to but not into the metacarpophalangeal joint, 5 mL of purulent material no tissue necrosis  Details of procedure  In the preop area the patient was identified in the surgical site was marked. Chart review was completed. He was taken to the operating room for general anesthesia. He was prepped and draped with Betadine. After sterile draping timeout was completed.  A made a longitudinal incision at the point of maximum fluctuance and immediate purulent material was drained approximate 5 mL. Anaerobic and aerobic cultures were obtained  The wound was explored with finger dissection. I did used to tourniquet because I wanted to determine if the fluid went into the joint and it did not. I move the thumb at the metacarpophalangeal joint pressed it and no purulent material was expressed  20 mL of plain Marcaine quarter percent was injected around the wound and a digital radial nerve block was also done with the Marcaine  All the purulence was in the subtenons tissue. I irrigated with a liter saline and then packed with a wet saline 4 x 4 gauze which was cut to fit the wound and then a sterile dressing was placed over that.  The tourniquet was let down the fingers have good color    SURGEON:  Surgeon(s) and Role:    * Stanley E Harrison, MD - Primary  PHYSICIAN ASSISTANT:   ASSISTANTS: none   ANESTHESIA:   general  EBL:  Total I/O In: 700 [I.V.:700] Out: 10 [Blood:10]  BLOOD ADMINISTERED:none  DRAINS: none   LOCAL MEDICATIONS USED:  MARCAINE     SPECIMEN:  No Specimen  DISPOSITION OF SPECIMEN:  N/A     COUNTS:  YES  TOURNIQUET:  * Missing tourniquet times found for  documented tourniquets in log:  [id number] *  DICTATION: .Dragon Dictation  PLAN OF CARE: Discharge to home after PACU  PATIENT DISPOSITION:  PACU - hemodynamically stable.   Delay start of Pharmacological VTE agent (>24hrs) due to surgical blood loss or risk of bleeding: not applicable  

## 2016-08-19 NOTE — Interval H&P Note (Signed)
History and Physical Interval Note:  08/19/2016 12:02 PM  Shane [name]  has presented today for surgery, with the diagnosis of LEFT THUMB INFECTION  The various methods of treatment have been discussed with the patient and family. After consideration of risks, benefits and other options for treatment, the patient has consented to  Procedure(s) with comments: INCISION AND DRAINAGE ABSCESS (Left) - LEFT THUMB as a surgical intervention .  The patient's history has been reviewed, patient examined, no change in status, stable for surgery.  I have reviewed the patient's chart and labs.  Questions were answered to the patient's satisfaction.     Fuller CanadaStanley Vendela Troung

## 2016-08-20 ENCOUNTER — Encounter: Payer: Self-pay | Admitting: Orthopedic Surgery

## 2016-08-20 ENCOUNTER — Ambulatory Visit (INDEPENDENT_AMBULATORY_CARE_PROVIDER_SITE_OTHER): Payer: Self-pay | Admitting: Orthopedic Surgery

## 2016-08-20 VITALS — Temp 98.2°F

## 2016-08-20 DIAGNOSIS — Z4889 Encounter for other specified surgical aftercare: Secondary | ICD-10-CM

## 2016-08-20 DIAGNOSIS — L02512 Cutaneous abscess of left hand: Secondary | ICD-10-CM

## 2016-08-20 NOTE — Progress Notes (Signed)
Patient ID: Hansel Starlingex A Yasin, male   DOB: 1976/01/17, 40 y.o.   MRN: [id number]  Post op visit   Chief Complaint  Patient presents with  . Follow-up    POST OP 1, I&D LEFT THUMB, DOS 08/19/16    Temp 98.2 F (36.8 C)   Incision drainage left thumb  Initial Gram stain gram-positive cocci in clusters  Dressing change today: LOOKS CLEAN   Continue wet-to-dry dressing change daily

## 2016-08-22 ENCOUNTER — Encounter: Payer: Self-pay | Admitting: Orthopedic Surgery

## 2016-08-22 ENCOUNTER — Ambulatory Visit (INDEPENDENT_AMBULATORY_CARE_PROVIDER_SITE_OTHER): Payer: Self-pay | Admitting: Orthopedic Surgery

## 2016-08-22 DIAGNOSIS — Z4889 Encounter for other specified surgical aftercare: Secondary | ICD-10-CM

## 2016-08-22 DIAGNOSIS — L02512 Cutaneous abscess of left hand: Secondary | ICD-10-CM

## 2016-08-22 NOTE — Progress Notes (Signed)
Patient ID: Hansel Starlingex A Upham, male   DOB: 10-15-1976, 40 y.o.   MRN: [id number]  Post op visit   Chief Complaint  Patient presents with  . Follow-up    POST OP I&D LEFT THUMB,  DOS 08/19/16      Wound check in the left thumb. Wound looks clean. FIBRINOUS exudate at the bottom patient feels better paragraph continue oral antibiotics and dressing changes wet to dry come back on Monday

## 2016-08-24 LAB — AEROBIC/ANAEROBIC CULTURE (SURGICAL/DEEP WOUND)

## 2016-08-24 LAB — AEROBIC/ANAEROBIC CULTURE W GRAM STAIN (SURGICAL/DEEP WOUND)

## 2016-08-25 ENCOUNTER — Encounter: Payer: Self-pay | Admitting: Orthopedic Surgery

## 2016-08-25 ENCOUNTER — Ambulatory Visit (INDEPENDENT_AMBULATORY_CARE_PROVIDER_SITE_OTHER): Payer: Self-pay | Admitting: Orthopedic Surgery

## 2016-08-25 DIAGNOSIS — L02512 Cutaneous abscess of left hand: Secondary | ICD-10-CM

## 2016-08-25 DIAGNOSIS — Z4889 Encounter for other specified surgical aftercare: Secondary | ICD-10-CM

## 2016-08-25 MED ORDER — HYDROCODONE-ACETAMINOPHEN 5-325 MG PO TABS: 1.0000 | ORAL_TABLET | Freq: Four times a day (QID) | ORAL | 0 refills | Status: DC | PRN

## 2016-08-25 MED ORDER — CLINDAMYCIN HCL 300 MG PO CAPS
300.0000 mg | ORAL_CAPSULE | Freq: Three times a day (TID) | ORAL | 0 refills | Status: DC
Start: 2016-08-25 — End: 2016-11-16

## 2016-08-25 NOTE — Progress Notes (Signed)
Patient ID: Shane [name], male   DOB: 07/06/1976, 40 y.o.   MRN: [id number]  Post op visit   Chief Complaint  Patient presents with  . Follow-up    POST OP 1, LEFT THUMB I&D, DOS 08/19/16   Dressing change today  The wound looks good and his MP joints a little stiff so we asked him to work on some passive range of motion exercises    Meds ordered this encounter  Medications  . clindamycin (CLEOCIN) 300 MG capsule    Sig: Take 1 capsule (300 mg total) by mouth 3 (three) times daily.    Dispense:  42 capsule    Refill:  0  . HYDROcodone-acetaminophen (NORCO) 5-325 MG tablet    Sig: Take 1-2 tablets by mouth every 6 (six) hours as needed for moderate pain.    Dispense:  40 tablet    Refill:  0     Return next week. He can go back to work limited duty

## 2016-08-25 NOTE — Patient Instructions (Addendum)
Continue saline wet-to-dry dressing changes  Continue antibiotics  Start range of motion exercises to the thumb  Out of work note from last week to yesterday

## 2016-09-05 ENCOUNTER — Ambulatory Visit (INDEPENDENT_AMBULATORY_CARE_PROVIDER_SITE_OTHER): Payer: Self-pay | Admitting: Orthopedic Surgery

## 2016-09-05 ENCOUNTER — Encounter: Payer: Self-pay | Admitting: Orthopedic Surgery

## 2016-09-05 DIAGNOSIS — Z4889 Encounter for other specified surgical aftercare: Secondary | ICD-10-CM

## 2016-09-05 DIAGNOSIS — L02512 Cutaneous abscess of left hand: Secondary | ICD-10-CM

## 2016-09-05 MED ORDER — HYDROCODONE-ACETAMINOPHEN 5-325 MG PO TABS: 1.0000 | ORAL_TABLET | Freq: Four times a day (QID) | ORAL | 0 refills | Status: DC | PRN

## 2016-09-05 NOTE — Patient Instructions (Signed)
Continue dressing changes until the wound closes

## 2016-09-05 NOTE — Progress Notes (Signed)
Patient ID: Shane [name], male   DOB: Aug 13, 1976, 40 y.o.   MRN: [id number]  Post op visit   Chief Complaint  Patient presents with  . Follow-up    POST OP LEFT THUMB, DOS 08/19/16    There were no vitals taken for this visit.  Norco 5 mg every 6 hours #56 refill  The wound is now 3 cm x 1.5 cm has a clean bed it's only 2-3 mm deep is moving his thumb normally  He is trying to work so when he hits it he does have some pain  This should heal up nicely as long as he continues to present course and he doesn't have to follow-up unless he has trouble

## 2016-11-16 ENCOUNTER — Emergency Department (HOSPITAL_COMMUNITY)
Admission: EM | Admit: 2016-11-16 | Discharge: 2016-11-16 | Disposition: A | Discharge status: Home / Self Care | Payer: BLUE CROSS/BLUE SHIELD | Attending: Emergency Medicine | Admitting: Emergency Medicine

## 2016-11-16 ENCOUNTER — Encounter (HOSPITAL_COMMUNITY): Payer: Self-pay | Admitting: Cardiology

## 2016-11-16 ENCOUNTER — Emergency Department (HOSPITAL_COMMUNITY): Payer: BLUE CROSS/BLUE SHIELD

## 2016-11-16 DIAGNOSIS — R042 Hemoptysis: Secondary | ICD-10-CM | POA: Insufficient documentation

## 2016-11-16 DIAGNOSIS — F17210 Nicotine dependence, cigarettes, uncomplicated: Secondary | ICD-10-CM | POA: Insufficient documentation

## 2016-11-16 LAB — CBC WITH DIFFERENTIAL/PLATELET
BASOS ABS: 0 10*3/uL (ref 0.0–0.1)
BASOS PCT: 0 %
Eosinophils Absolute: 0.1 10*3/uL (ref 0.0–0.7)
Eosinophils Relative: 2 %
HEMATOCRIT: 46.8 % (ref 39.0–52.0)
HEMOGLOBIN: 15.5 g/dL (ref 13.0–17.0)
Lymphocytes Relative: 20 %
Lymphs Abs: 1.3 10*3/uL (ref 0.7–4.0)
MCH: 30.3 pg (ref 26.0–34.0)
MCHC: 33.1 g/dL (ref 30.0–36.0)
MCV: 91.4 fL (ref 78.0–100.0)
MONO ABS: 0.9 10*3/uL (ref 0.1–1.0)
Monocytes Relative: 14 %
NEUTROS ABS: 4 10*3/uL (ref 1.7–7.7)
NEUTROS PCT: 64 %
Platelets: 165 10*3/uL (ref 150–400)
RBC: 5.12 MIL/uL (ref 4.22–5.81)
RDW: 12.9 % (ref 11.5–15.5)
WBC: 6.3 10*3/uL (ref 4.0–10.5)

## 2016-11-16 LAB — COMPREHENSIVE METABOLIC PANEL
ALT: 22 U/L (ref 17–63)
AST: 26 U/L (ref 15–41)
Albumin: 4.2 g/dL (ref 3.5–5.0)
Alkaline Phosphatase: 48 U/L (ref 38–126)
Anion gap: 9 (ref 5–15)
BILIRUBIN TOTAL: 0.9 mg/dL (ref 0.3–1.2)
BUN: 8 mg/dL (ref 6–20)
CO2: 27 mmol/L (ref 22–32)
Calcium: 9.2 mg/dL (ref 8.9–10.3)
Chloride: 103 mmol/L (ref 101–111)
Creatinine, Ser: 1.03 mg/dL (ref 0.61–1.24)
GFR calc Af Amer: >60 mL/min (ref >60)
GFR calc non Af Amer: >60 mL/min (ref >60)
Glucose, Bld: 91 mg/dL (ref 65–99)
POTASSIUM: 3.4 mmol/L — AB (ref 3.5–5.1)
Sodium: 139 mmol/L (ref 135–145)
TOTAL PROTEIN: 7.4 g/dL (ref 6.5–8.1)
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
## 2016-11-16 NOTE — ED Notes (Signed)
Pt reports hemoptysis in the past, was worked up but has noticed that it is happening with more frequency
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
## 2016-11-18 DIAGNOSIS — R042 Hemoptysis: Secondary | ICD-10-CM | POA: Insufficient documentation

## 2016-11-19 DIAGNOSIS — J382 Nodules of vocal cords: Secondary | ICD-10-CM | POA: Insufficient documentation

## 2016-11-24 ENCOUNTER — Encounter (HOSPITAL_COMMUNITY): Payer: Self-pay | Admitting: *Deleted

## 2016-11-24 ENCOUNTER — Other Ambulatory Visit: Payer: Self-pay | Admitting: Otolaryngology

## 2016-11-24 NOTE — Progress Notes (Signed)
Pt denies SOB, chest pain, and being under the care of a cardiologist. Pt denies having a stress test, echo and cardiac cath. Pt denies having an EKG within the last year. Pt made aware to stop taking  Aspirin, vitamins and herbal medications. Do not take any NSAIDs ie: Ibuprofen, Advil, Naproxen, BC and Goody Powder or any medication containing Aspirin. Pt verbalized understanding of all pre-op instrcutions.

## 2016-11-25 ENCOUNTER — Encounter (HOSPITAL_COMMUNITY)
Admission: RE | Disposition: A | Discharge status: Home / Self Care | Payer: Self-pay | Source: Ambulatory Visit | Attending: Otolaryngology

## 2016-11-25 ENCOUNTER — Ambulatory Visit (HOSPITAL_COMMUNITY): Payer: BLUE CROSS/BLUE SHIELD | Admitting: Anesthesiology

## 2016-11-25 ENCOUNTER — Ambulatory Visit (HOSPITAL_COMMUNITY)
Admission: RE | Admit: 2016-11-25 | Discharge: 2016-11-25 | Disposition: A | Discharge status: Home / Self Care | Payer: BLUE CROSS/BLUE SHIELD | Source: Ambulatory Visit | Attending: Otolaryngology | Admitting: Otolaryngology

## 2016-11-25 ENCOUNTER — Encounter (HOSPITAL_COMMUNITY): Payer: Self-pay | Admitting: *Deleted

## 2016-11-25 DIAGNOSIS — Z8614 Personal history of Methicillin resistant Staphylococcus aureus infection: Secondary | ICD-10-CM | POA: Insufficient documentation

## 2016-11-25 DIAGNOSIS — Z881 Allergy status to other antibiotic agents status: Secondary | ICD-10-CM | POA: Diagnosis not present

## 2016-11-25 DIAGNOSIS — Z79899 Other long term (current) drug therapy: Secondary | ICD-10-CM | POA: Insufficient documentation

## 2016-11-25 DIAGNOSIS — J381 Polyp of vocal cord and larynx: Secondary | ICD-10-CM | POA: Diagnosis present

## 2016-11-25 DIAGNOSIS — Z880 Allergy status to penicillin: Secondary | ICD-10-CM | POA: Insufficient documentation

## 2016-11-25 DIAGNOSIS — K219 Gastro-esophageal reflux disease without esophagitis: Secondary | ICD-10-CM | POA: Diagnosis not present

## 2016-11-25 DIAGNOSIS — J382 Nodules of vocal cords: Secondary | ICD-10-CM | POA: Diagnosis not present

## 2016-11-25 DIAGNOSIS — F17210 Nicotine dependence, cigarettes, uncomplicated: Secondary | ICD-10-CM | POA: Insufficient documentation

## 2016-11-25 DIAGNOSIS — Z9889 Other specified postprocedural states: Secondary | ICD-10-CM | POA: Diagnosis not present

## 2016-11-25 HISTORY — DX: Nodules of vocal cords: J38.2

## 2016-11-25 HISTORY — PX: MICROLARYNGOSCOPY WITH CO2 LASER AND EXCISION OF VOCAL CORD LESION: SHX5970

## 2016-11-25 SURGERY — MICROLARYNGOSCOPY WITH CO2 LASER AND EXCISION OF VOCAL CORD LESION
Anesthesia: General | Site: Throat

## 2016-11-25 MED ORDER — LACTATED RINGERS IV SOLN
INTRAVENOUS | Status: DC
  Administered 2016-11-25 (×2): via INTRAVENOUS

## 2016-11-25 MED ORDER — EPINEPHRINE HCL (NASAL) 0.1 % NA SOLN
NASAL | Status: AC
  Filled 2016-11-25: qty 30

## 2016-11-25 MED ORDER — ONDANSETRON HCL 4 MG/2ML IJ SOLN
INTRAMUSCULAR | Status: DC | PRN
  Administered 2016-11-25: 4 mg via INTRAVENOUS

## 2016-11-25 MED ORDER — DEXAMETHASONE SODIUM PHOSPHATE 10 MG/ML IJ SOLN
INTRAMUSCULAR | Status: DC | PRN
  Administered 2016-11-25: 10 mg via INTRAVENOUS

## 2016-11-25 MED ORDER — PROPOFOL 10 MG/ML IV BOLUS
INTRAVENOUS | Status: DC | PRN
  Administered 2016-11-25: 200 mg via INTRAVENOUS

## 2016-11-25 MED ORDER — FENTANYL CITRATE (PF) 100 MCG/2ML IJ SOLN
INTRAMUSCULAR | Status: AC
  Filled 2016-11-25: qty 4

## 2016-11-25 MED ORDER — HYDROCODONE-ACETAMINOPHEN 5-325 MG PO TABS
1.0000 | ORAL_TABLET | Freq: Once | ORAL | Status: AC
  Administered 2016-11-25: 1 via ORAL

## 2016-11-25 MED ORDER — PROPOFOL 10 MG/ML IV BOLUS
INTRAVENOUS | Status: AC
  Filled 2016-11-25: qty 20

## 2016-11-25 MED ORDER — MIDAZOLAM HCL 5 MG/5ML IJ SOLN
INTRAMUSCULAR | Status: DC | PRN
  Administered 2016-11-25: 2 mg via INTRAVENOUS

## 2016-11-25 MED ORDER — PROMETHAZINE HCL 25 MG/ML IJ SOLN: 6.2500 mg | INTRAMUSCULAR | Status: DC | PRN

## 2016-11-25 MED ORDER — ROCURONIUM BROMIDE 10 MG/ML (PF) SYRINGE
PREFILLED_SYRINGE | INTRAVENOUS | Status: DC | PRN
  Administered 2016-11-25: 40 mg via INTRAVENOUS
  Administered 2016-11-25: 10 mg via INTRAVENOUS

## 2016-11-25 MED ORDER — FENTANYL CITRATE (PF) 100 MCG/2ML IJ SOLN
INTRAMUSCULAR | Status: DC | PRN
  Administered 2016-11-25: 100 ug via INTRAVENOUS
  Administered 2016-11-25 (×2): 50 ug via INTRAVENOUS

## 2016-11-25 MED ORDER — FENTANYL CITRATE (PF) 100 MCG/2ML IJ SOLN
INTRAMUSCULAR | Status: AC
  Filled 2016-11-25: qty 2

## 2016-11-25 MED ORDER — MIDAZOLAM HCL 2 MG/2ML IJ SOLN
INTRAMUSCULAR | Status: AC
  Filled 2016-11-25: qty 2

## 2016-11-25 MED ORDER — LIDOCAINE 2% (20 MG/ML) 5 ML SYRINGE
INTRAMUSCULAR | Status: DC | PRN
  Administered 2016-11-25: 100 mg via INTRAVENOUS

## 2016-11-25 MED ORDER — FENTANYL CITRATE (PF) 100 MCG/2ML IJ SOLN
25.0000 ug | INTRAMUSCULAR | Status: DC | PRN
  Administered 2016-11-25: 50 ug via INTRAVENOUS
  Administered 2016-11-25 (×2): 25 ug via INTRAVENOUS

## 2016-11-25 MED ORDER — EPINEPHRINE PF 1 MG/ML IJ SOLN
INTRAMUSCULAR | Status: DC | PRN
  Administered 2016-11-25: 1 mg

## 2016-11-25 MED ORDER — SUGAMMADEX SODIUM 200 MG/2ML IV SOLN
INTRAVENOUS | Status: DC | PRN
  Administered 2016-11-25: 200 mg via INTRAVENOUS

## 2016-11-25 MED ORDER — HYDROCODONE-ACETAMINOPHEN 5-325 MG PO TABS
ORAL_TABLET | ORAL | Status: AC
  Filled 2016-11-25: qty 1

## 2016-11-25 MED ORDER — 0.9 % SODIUM CHLORIDE (POUR BTL) OPTIME
TOPICAL | Status: DC | PRN
  Administered 2016-11-25: 1000 mL

## 2016-11-25 MED ORDER — MUPIROCIN 2 % EX OINT: 1.0000 "application " | TOPICAL_OINTMENT | Freq: Once | CUTANEOUS | Status: DC

## 2016-11-25 SURGICAL SUPPLY — 32 items
BLADE SURG 10 STRL SS (BLADE) ×3 IMPLANT
BLADE SURG 15 STRL LF DISP TIS (BLADE) IMPLANT
BLADE SURG 15 STRL SS (BLADE)
CANISTER SUCTION 1200CC (MISCELLANEOUS) ×3 IMPLANT
CONT SPECI 4OZ STER CLIK (MISCELLANEOUS) ×3 IMPLANT
COVER MAYO STAND STRL (DRAPES) ×3 IMPLANT
COVER TABLE BACK 60X90 (DRAPES) ×3 IMPLANT
DRAPE PROXIMA HALF (DRAPES) ×3 IMPLANT
GAUZE SPONGE 4X4 12PLY STRL (GAUZE/BANDAGES/DRESSINGS) IMPLANT
GLOVE ECLIPSE 7.5 STRL STRAW (GLOVE) ×3 IMPLANT
GOWN STRL REUS W/ TWL XL LVL3 (GOWN DISPOSABLE) IMPLANT
GOWN STRL REUS W/TWL XL LVL3 (GOWN DISPOSABLE)
GUARD TEETH (MISCELLANEOUS) IMPLANT
KIT BASIN OR (CUSTOM PROCEDURE TRAY) ×3 IMPLANT
NDL HYPO 18GX1.5 BLUNT FILL (NEEDLE) IMPLANT
NDL HYPO 25GX1X1/2 BEV (NEEDLE) IMPLANT
NDL SPNL 22GX3.5 QUINCKE BK (NEEDLE) IMPLANT
NEEDLE HYPO 18GX1.5 BLUNT FILL (NEEDLE) IMPLANT
NEEDLE HYPO 25GX1X1/2 BEV (NEEDLE) IMPLANT
NEEDLE SPNL 22GX3.5 QUINCKE BK (NEEDLE) IMPLANT
NS IRRIG 1000ML POUR BTL (IV SOLUTION) ×3 IMPLANT
PAD EYE OVAL STERILE LF (GAUZE/BANDAGES/DRESSINGS) ×6 IMPLANT
PATTIES SURGICAL .5 X3 (DISPOSABLE) ×2 IMPLANT
SOLUTION ANTI FOG 6CC (MISCELLANEOUS) ×3 IMPLANT
SOLUTION BUTLER CLEAR DIP (MISCELLANEOUS) ×3 IMPLANT
SURGILUBE 2OZ TUBE FLIPTOP (MISCELLANEOUS) IMPLANT
SYR 5ML LL (SYRINGE) ×3 IMPLANT
SYR CONTROL 10ML LL (SYRINGE) IMPLANT
TOWEL OR 17X24 6PK STRL BLUE (TOWEL DISPOSABLE) ×3 IMPLANT
TUBE CONNECTING 20'X1/4 (TUBING) ×1
TUBE CONNECTING 20X1/4 (TUBING) ×2 IMPLANT
WATER STERILE IRR 1000ML POUR (IV SOLUTION) ×1 IMPLANT

## 2016-11-25 NOTE — Anesthesia Postprocedure Evaluation (Signed)
Anesthesia Post Note  Patient: Shane [name]  Procedure(s) Performed: Procedure(s) (LRB): MICROLARYNGOSCOPY WITH CO2 LASER AND EXCISION OF VOCAL CORD LESION (N/A)  Patient location during evaluation: PACU Anesthesia Type: General Level of consciousness: awake and alert Pain management: pain level controlled Vital Signs Assessment: post-procedure vital signs reviewed and stable Respiratory status: spontaneous breathing, nonlabored ventilation, respiratory function stable and patient connected to nasal cannula oxygen Cardiovascular status: blood pressure returned to baseline and stable Postop Assessment: no signs of nausea or vomiting Anesthetic complications: no       Last Vitals:  Vitals:   11/25/16 1150 11/25/16 1204  BP: 122/79 125/78  Pulse: (!) 54 63  Resp: 17 18  Temp: 36.7 C     Last Pain:  Vitals:   11/25/16 1150  TempSrc:   PainSc: 3                  Kennieth RadFitzgerald, Labresha Mellor E

## 2016-11-25 NOTE — Anesthesia Preprocedure Evaluation (Signed)
Anesthesia Evaluation  Patient identified by MRN, date of birth, ID band Patient awake    Reviewed: Allergy & Precautions, NPO status , Patient's Chart, lab work & pertinent test results  Airway Mallampati: II  TM Distance: >3 FB Neck ROM: Full    Dental  (+) Dental Advisory Given   Pulmonary Current Smoker,    breath sounds clear to auscultation       Cardiovascular negative cardio ROS   Rhythm:Regular Rate:Normal     Neuro/Psych negative neurological ROS     GI/Hepatic Neg liver ROS, GERD  ,  Endo/Other  negative endocrine ROS  Renal/GU negative Renal ROS     Musculoskeletal   Abdominal   Peds  Hematology negative hematology ROS (+)   Anesthesia Other Findings   Reproductive/Obstetrics                             Lab Results  Component Value Date   WBC 6.3 11/16/2016   HGB 15.5 11/16/2016   HCT 46.8 11/16/2016   MCV 91.4 11/16/2016   PLT 165 11/16/2016   Lab Results  Component Value Date   CREATININE 1.03 11/16/2016   BUN 8 11/16/2016   NA 139 11/16/2016   K 3.4 (L) 11/16/2016   CL 103 11/16/2016   CO2 27 11/16/2016    Anesthesia Physical Anesthesia Plan  ASA: II  Anesthesia Plan: General   Post-op Pain Management:    Induction: Intravenous  Airway Management Planned: Oral ETT  Additional Equipment:   Intra-op Plan:   Post-operative Plan: Extubation in OR  Informed Consent: I have reviewed the patients History and Physical, chart, labs and discussed the procedure including the risks, benefits and alternatives for the proposed anesthesia with the patient or authorized representative who has indicated his/her understanding and acceptance.   Dental advisory given  Plan Discussed with:   Anesthesia Plan Comments:         Anesthesia Quick Evaluation
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
## 2016-11-25 NOTE — Op Note (Signed)
Preop/postop diagnosis: Vocal cord polyp Procedure: Microlaryngoscopy with laser of right vocal cord polyp Anesthesia: Gen. Estimated blood loss: Less than 5 cc Indications: 41 year old with a hot hemoptysis problem that has been going on for over a year. On examination a vascular appearing focal cord nodule was identified which could be the source. He understands it may not be the source and that he will need further workup. We talked about the microlaryngoscopy with laser. We discussed risks, benefits, and options. All his questions were answered and consent was obtained. Procedure: Patient was taken to the operating room placed in the supine position after general endotracheal tube with a laser tube was examined with a Dedo's laser scope. The vocal cords looked normal except for the polyp that was on the right anterior cord. It was right along the vocal fold. The patient's face and eyes covered  with wet towels and a moist pledget was placed below the vocal cords protecting the tube as well as the subglottis. The nodule was grasped with a cup forcep and drawn medially and then laser was used to make an incision along its lateral edge.The laser was set on 2 W. It was dissected with the scissors and removed. The base was then cauterized with the laser to get good hemostasis. An adrenaline-soaked pledget was placed.  The vocal fold was straight and without excessive charring. There was no bleeding. The pledget was removed. The patient was awake and brought to recovery room in stable condition counts correct

## 2016-11-25 NOTE — Progress Notes (Signed)
Pt c/o discomfort "like a cut" at upper gum area. Small cut is visible. Dr Glade Stanford Fitzgerald here & aware.

## 2016-11-25 NOTE — Transfer of Care (Signed)
Immediate Anesthesia Transfer of Care Note  Patient: Shane [name]  Procedure(s) Performed: Procedure(s): MICROLARYNGOSCOPY WITH CO2 LASER AND EXCISION OF VOCAL CORD LESION (N/A)  Patient Location: PACU  Anesthesia Type:General  Level of Consciousness: awake, oriented, patient cooperative and responds to stimulation  Airway & Oxygen Therapy: Patient Spontanous Breathing and Patient connected to nasal cannula oxygen  Post-op Assessment: Report given to RN, Post -op Vital signs reviewed and stable and Patient moving all extremities X 4  Post vital signs: Reviewed and stable  Last Vitals:  Vitals:   11/25/16 0805  BP: 131/73  Pulse: (!) 54  Resp: 18  Temp: 36.7 C    Last Pain:  Vitals:   11/25/16 0805  TempSrc: Oral      Patients Stated Pain Goal: 4 (11/25/16 16100812)  Complications: No apparent anesthesia complications

## 2016-11-25 NOTE — Discharge Instructions (Addendum)
Call if any problems including bleeding or breathing problems. Follow-up in about 3 weeks. Regular diet should be appropriate. Voice rest is helpful for at least 1 week.

## 2016-11-26 ENCOUNTER — Encounter (HOSPITAL_COMMUNITY): Payer: Self-pay | Admitting: Otolaryngology

## 2016-12-17 ENCOUNTER — Other Ambulatory Visit: Payer: Self-pay | Admitting: *Deleted

## 2016-12-17 ENCOUNTER — Ambulatory Visit (INDEPENDENT_AMBULATORY_CARE_PROVIDER_SITE_OTHER): Payer: BLUE CROSS/BLUE SHIELD | Admitting: Orthopaedic Surgery

## 2016-12-17 ENCOUNTER — Ambulatory Visit (HOSPITAL_COMMUNITY)
Admission: RE | Admit: 2016-12-17 | Discharge: 2016-12-17 | Disposition: A | Discharge status: Home / Self Care | Payer: BLUE CROSS/BLUE SHIELD | Source: Ambulatory Visit | Attending: Orthopaedic Surgery | Admitting: Orthopaedic Surgery

## 2016-12-17 VITALS — BP 128/78 | HR 76 | Temp 97.9°F | Ht 71.0 in | Wt 178.0 lb

## 2016-12-17 DIAGNOSIS — X58XXXA Exposure to other specified factors, initial encounter: Secondary | ICD-10-CM | POA: Insufficient documentation

## 2016-12-17 DIAGNOSIS — L089 Local infection of the skin and subcutaneous tissue, unspecified: Secondary | ICD-10-CM

## 2016-12-17 DIAGNOSIS — S60453A Superficial foreign body of left middle finger, initial encounter: Secondary | ICD-10-CM

## 2016-12-17 MED ORDER — DOXYCYCLINE HYCLATE 100 MG PO CAPS: 100.0000 mg | ORAL_CAPSULE | Freq: Two times a day (BID) | ORAL | 0 refills | Status: DC

## 2016-12-17 MED ORDER — DOXYCYCLINE HYCLATE 100 MG PO CAPS
100.0000 mg | ORAL_CAPSULE | Freq: Two times a day (BID) | ORAL | 0 refills | Status: DC
Start: 2016-12-17 — End: 2016-12-17

## 2016-12-17 MED ORDER — HYDROCODONE-ACETAMINOPHEN 5-325 MG PO TABS: ORAL_TABLET | ORAL | 0 refills | Status: DC
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
## 2016-12-19 ENCOUNTER — Encounter: Payer: Self-pay | Admitting: *Deleted

## 2016-12-19 ENCOUNTER — Ambulatory Visit (INDEPENDENT_AMBULATORY_CARE_PROVIDER_SITE_OTHER): Payer: BLUE CROSS/BLUE SHIELD | Admitting: Orthopedic Surgery

## 2016-12-19 VITALS — BP 123/83 | HR 74 | Ht 71.0 in | Wt 179.0 lb

## 2016-12-19 DIAGNOSIS — L089 Local infection of the skin and subcutaneous tissue, unspecified: Secondary | ICD-10-CM

## 2016-12-19 NOTE — Progress Notes (Signed)
Per patient plan, surgical pre authorization not required for CPT 26034 on outpatient basis Reference Shane S. 12/19/16 12:50

## 2016-12-19 NOTE — Progress Notes (Signed)
FOLLOW UP VISIT   Patient ID: Shane [name], male   DOB: 17-Sep-1976, 41 y.o.   MRN: [id number]  Chief Complaint  Patient presents with  . Hand Pain    Left long finger, DOI 12-15-16.  Patient had a piece of metal cut his finger on the volar aspect left long finger on the 19th these been on antibiotics for 2 days it still swollen and still hurting he is losing some motion in flexion extension it is not distal or proximal to the proximal phalanx and is not extending into the palm  HPI Shane [name] is a 41 y.o. male.   HPI  As above  Review of Systems Review of Systems  Constitutional: Negative for fever.  Neurological: Negative for numbness.    Past Medical History:  Diagnosis Date  . GERD (gastroesophageal reflux disease)   . MRSA (methicillin resistant staph aureus) culture positive   . Vocal cord nodule       Physical Exam  Constitutional: He is oriented to person, place, and time. He appears well-developed and well-nourished. No distress.  Cardiovascular: Normal rate and intact distal pulses.   Neurological: He is alert and oriented to person, place, and time.  Skin: Skin is warm and dry. No rash noted. He is not diaphoretic. No erythema. No pallor.  Psychiatric: He has a normal mood and affect. His behavior is normal. Judgment and thought content normal.    Left long finger swelling and tenderness over the volar aspect of the proximal phalanx is no tenderness distal to proximal there is more tenderness on the ulnar side and more redness there. There does not appear to be any fusiform swelling just isolated swelling there is a puncture wound over the volar aspect. Loss of motion is noted flexor tendon strength is normal   MEDICAL DECISION MAKING  DATA   X-ray shows soft tissue swelling without any foreign body  DIAGNOSIS  Encounter Diagnosis  Name Primary?  . Finger infection Yes    PLAN(RISK)    Continue antibiotics at present so with warm water absent salt  and we will try to do an incision and drainage on Thursday

## 2016-12-22 NOTE — Patient Instructions (Signed)
Shane [name]  12/22/2016     @PREFPERIOPPHARMACY @   Your procedure is scheduled on 12/25/2016  Report to [facility] at 9:00 A.M.  Call this number if you have problems the morning of surgery:  [phone]   Remember:  Do not eat food or drink liquids after midnight.  Take these medicines the morning of surgery with A SIP OF WATER Hydrocodone if needed, Prilosec, Doxycycline    Do not wear jewelry, make-up or nail polish.  Do not wear lotions, powders, or perfumes, or deoderant.  Do not shave 48 hours prior to surgery.  Men may shave face and neck.  Do not bring valuables to the hospital.  [facility] is not responsible for any belongings or valuables.  Contacts, dentures or bridgework may not be worn into surgery.  Leave your suitcase in the car.  After surgery it may be brought to your room.  For patients admitted to the hospital, discharge time will be determined by your treatment team.  Patients discharged the day of surgery will not be allowed to drive home.    Please read over the following fact sheets that you were given. Surgical Site Infection Prevention and Anesthesia Post-op Instructions     PATIENT INSTRUCTIONS POST-ANESTHESIA  IMMEDIATELY FOLLOWING SURGERY:  Do not drive or operate machinery for the first twenty four hours after surgery.  Do not make any important decisions for twenty four hours after surgery or while taking narcotic pain medications or sedatives.  If you develop intractable nausea and vomiting or a severe headache please notify your doctor immediately.  FOLLOW-UP:  Please make an appointment with your surgeon as instructed. You do not need to follow up with anesthesia unless specifically instructed to do so.  WOUND CARE INSTRUCTIONS (if applicable):  Keep a dry clean dressing on the anesthesia/puncture wound site if there is drainage.  Once the wound has quit draining you may leave it open to air.  Generally you should leave the bandage intact  for twenty four hours unless there is drainage.  If the epidural site drains for more than 36-48 hours please call the anesthesia department.  QUESTIONS?:  Please feel free to call your physician or the hospital operator if you have any questions, and they will be happy to assist you.      Incision and Drainage Incision and drainage is a surgical procedure to open and drain a fluid-filled sac. The sac may be filled with pus, mucus, or blood. Examples of fluid-filled sacs that may need surgical drainage include cysts, skin infections (abscesses), and red lumps that develop from a ruptured cyst or a small abscess (boils). You may need this procedure if the affected area is large, painful, infected, or not healing well. Tell a health care provider about:  Any allergies you have.  All medicines you are taking, including vitamins, herbs, eye drops, creams, and over-the-counter medicines.  Any problems you or family members have had with anesthetic medicines.  Any blood disorders you have.  Any surgeries you have had.  Any medical conditions you have.  Whether you are pregnant or may be pregnant. What are the risks? Generally, this is a safe procedure. However, problems may occur, including:  Infection.  Bleeding.  Allergic reactions to medicines.  Scarring. What happens before the procedure?  You may need an ultrasound or other imaging tests to see how large or deep the fluid-filled sac is.  You may have blood tests to check for infection.  You  may get a tetanus shot.  You may be given antibiotic medicine to help prevent infection.  Follow instructions from your health care provider about eating or drinking restrictions.  Ask your health care provider about:  Changing or stopping your regular medicines. This is especially important if you are taking diabetes medicines or blood thinners.  Taking medicines such as aspirin and ibuprofen. These medicines can thin your blood.  Do not take these medicines before your procedure if your health care provider instructs you not to.  Plan to have someone take you home after the procedure.  If you will be going home right after the procedure, plan to have someone stay with you for 24 hours. What happens during the procedure?  To reduce your risk of infection:  Your health care team will wash or sanitize their hands.  Your skin will be washed with soap.  You will be given one or more of the following:  A medicine to help you relax (sedative).  A medicine to numb the area (local anesthetic).  A medicine to make you fall asleep (general anesthetic).  An incision will be made in the top of the fluid-filled sac.  The contents of the sac may be squeezed out, or a syringe or tube (catheter)may be used to empty the sac.  The catheter may be left in place for several weeks to drain any fluid. Or, your health care provider may stitch open the edges of the incision to make a long-term opening for drainage (marsupialization).  The inside of the sac may be washed out (irrigated) with a sterile solution and packed with gauze before it is covered with a bandage (dressing). The procedure may vary among health care providers and hospitals. What happens after the procedure?  Your blood pressure, heart rate, breathing rate, and blood oxygen level will be monitored often until the medicines you were given have worn off.  Do not drive for 24 hours if you received a sedative. This information is not intended to replace advice given to you by your health care provider. Make sure you discuss any questions you have with your health care provider. Document Released: 04/08/2001 Document Revised: 03/20/2016 Document Reviewed: 08/03/2015 Elsevier Interactive Patient Education  2017 [employer]

## 2016-12-23 ENCOUNTER — Telehealth: Payer: Self-pay | Admitting: Orthopedic Surgery

## 2016-12-23 NOTE — Telephone Encounter (Signed)
OK TO CANCEL BUT LET ME SEE HIM WEDS AM

## 2016-12-23 NOTE — Telephone Encounter (Signed)
Patient coming in 9:15am please put on schedule

## 2016-12-23 NOTE — Telephone Encounter (Signed)
I CANNOT RECOMMEND HE CANCEL SURGERY, HOW SHOULD I PROCEED WITH THIS?

## 2016-12-23 NOTE — Telephone Encounter (Signed)
Patient called to further discuss the upcoming surgery (12/25/16) and the pre-op (12/24/16) at [facility].  States his finger has responded well to the anti-biotics and that the swelling is down.  Inquiring about still moving forward with the surgery?  Ph# [phone].

## 2016-12-24 ENCOUNTER — Encounter (HOSPITAL_COMMUNITY): Payer: Self-pay

## 2016-12-24 ENCOUNTER — Encounter: Payer: Self-pay | Admitting: Orthopedic Surgery

## 2016-12-24 ENCOUNTER — Encounter (HOSPITAL_COMMUNITY)
Admission: RE | Admit: 2016-12-24 | Discharge: 2016-12-24 | Disposition: A | Discharge status: Home / Self Care | Payer: BLUE CROSS/BLUE SHIELD | Source: Ambulatory Visit | Attending: Orthopedic Surgery | Admitting: Orthopedic Surgery

## 2016-12-24 ENCOUNTER — Ambulatory Visit (INDEPENDENT_AMBULATORY_CARE_PROVIDER_SITE_OTHER): Payer: BLUE CROSS/BLUE SHIELD | Admitting: Orthopedic Surgery

## 2016-12-24 DIAGNOSIS — L089 Local infection of the skin and subcutaneous tissue, unspecified: Secondary | ICD-10-CM

## 2016-12-24 MED ORDER — DOXYCYCLINE HYCLATE 100 MG PO CAPS: 100.0000 mg | ORAL_CAPSULE | Freq: Two times a day (BID) | ORAL | 0 refills | Status: DC

## 2016-12-24 NOTE — Patient Instructions (Signed)
CONTINUE SOAKING DAILY  AND TAKE ANTIBIOTIC

## 2016-12-24 NOTE — Telephone Encounter (Signed)
Patient scheduled accordingly for today, 12/24/16, and was seen by Dr Romeo AppleHarrison as noted.

## 2016-12-24 NOTE — Progress Notes (Signed)
FOLLOW UP  The patient was scheduled for incision and drainage of his left long finger however with antibiotics and soaking he has had a major improvement. He expressed some purulent material from the finger. Is regained almost all of his range of motion area  The swelling has significantly decreased has a little area of tenderness and still has some little bit of swelling so I've asked him to continue his antibiotics and soaking of the finger and follow-up with me in about 10 days

## 2016-12-25 ENCOUNTER — Encounter (HOSPITAL_COMMUNITY): Admission: RE | Payer: Self-pay | Source: Ambulatory Visit

## 2016-12-25 ENCOUNTER — Ambulatory Visit (HOSPITAL_COMMUNITY)
Admission: RE | Admit: 2016-12-25 | Payer: BLUE CROSS/BLUE SHIELD | Source: Ambulatory Visit | Admitting: Orthopedic Surgery

## 2016-12-25 SURGERY — INCISION AND DRAINAGE
Anesthesia: General | Laterality: Left

## 2017-01-02 ENCOUNTER — Ambulatory Visit (HOSPITAL_COMMUNITY)
Admission: RE | Admit: 2017-01-02 | Discharge: 2017-01-02 | Disposition: A | Discharge status: Home / Self Care | Payer: BLUE CROSS/BLUE SHIELD | Source: Ambulatory Visit | Attending: Family Medicine | Admitting: Family Medicine

## 2017-01-02 ENCOUNTER — Other Ambulatory Visit (HOSPITAL_COMMUNITY): Payer: Self-pay | Admitting: Family Medicine

## 2017-01-02 DIAGNOSIS — M7591 Shoulder lesion, unspecified, right shoulder: Secondary | ICD-10-CM | POA: Insufficient documentation

## 2017-01-02 DIAGNOSIS — G8929 Other chronic pain: Secondary | ICD-10-CM | POA: Insufficient documentation

## 2017-01-02 DIAGNOSIS — M5136 Other intervertebral disc degeneration, lumbar region: Secondary | ICD-10-CM | POA: Insufficient documentation

## 2017-01-06 ENCOUNTER — Ambulatory Visit (INDEPENDENT_AMBULATORY_CARE_PROVIDER_SITE_OTHER): Payer: BLUE CROSS/BLUE SHIELD | Admitting: Orthopedic Surgery

## 2017-01-06 ENCOUNTER — Encounter: Payer: Self-pay | Admitting: Orthopedic Surgery

## 2017-01-06 DIAGNOSIS — L089 Local infection of the skin and subcutaneous tissue, unspecified: Secondary | ICD-10-CM

## 2017-01-06 NOTE — Progress Notes (Signed)
FOLLOW UP VISIT   Patient ID: Shane [name], male   DOB: 1976/02/10, 41 y.o.   MRN: [id number]  Chief Complaint  Patient presents with  . Follow-up    Recheck on left long finger, DOI 12-15-16.    HPI Shane [name] is a 41 y.o. male.   HPI  41 year old male hand and finger infection in his left long finger and we treated him with oral antibiotics and he has no residual pain  Review of Systems Review of Systems     Physical Exam  Full range of motion no tenderness in the finger or the joint, no erythema   MEDICAL DECISION MAKING  DATA     Encounter Diagnosis  Name Primary?  . Finger infection Yes      PLAN(RISK)    Released follow-up as needed

## 2017-01-07 ENCOUNTER — Encounter: Payer: Self-pay | Admitting: Orthopedic Surgery

## 2017-03-01 ENCOUNTER — Emergency Department (HOSPITAL_COMMUNITY)
Admission: EM | Admit: 2017-03-01 | Discharge: 2017-03-01 | Disposition: A | Discharge status: Home / Self Care | Payer: Self-pay | Attending: Emergency Medicine | Admitting: Emergency Medicine

## 2017-03-01 ENCOUNTER — Emergency Department (HOSPITAL_COMMUNITY): Payer: Self-pay

## 2017-03-01 ENCOUNTER — Encounter (HOSPITAL_COMMUNITY): Payer: Self-pay

## 2017-03-01 DIAGNOSIS — Z79899 Other long term (current) drug therapy: Secondary | ICD-10-CM | POA: Insufficient documentation

## 2017-03-01 DIAGNOSIS — R1013 Epigastric pain: Secondary | ICD-10-CM | POA: Insufficient documentation

## 2017-03-01 DIAGNOSIS — R911 Solitary pulmonary nodule: Secondary | ICD-10-CM | POA: Insufficient documentation

## 2017-03-01 DIAGNOSIS — F17210 Nicotine dependence, cigarettes, uncomplicated: Secondary | ICD-10-CM | POA: Insufficient documentation

## 2017-03-01 LAB — COMPREHENSIVE METABOLIC PANEL
ALT: 21 U/L (ref 17–63)
ANION GAP: 14 (ref 5–15)
AST: 29 U/L (ref 15–41)
Albumin: 4.8 g/dL (ref 3.5–5.0)
Alkaline Phosphatase: 66 U/L (ref 38–126)
BUN: 9 mg/dL (ref 6–20)
CALCIUM: 10.5 mg/dL — AB (ref 8.9–10.3)
CHLORIDE: 105 mmol/L (ref 101–111)
CO2: 23 mmol/L (ref 22–32)
CREATININE: 0.99 mg/dL (ref 0.61–1.24)
Glucose, Bld: 114 mg/dL — ABNORMAL HIGH (ref 65–99)
Potassium: 4.2 mmol/L (ref 3.5–5.1)
SODIUM: 142 mmol/L (ref 135–145)
Total Bilirubin: 1 mg/dL (ref 0.3–1.2)
Total Protein: 8.8 g/dL — ABNORMAL HIGH (ref 6.5–8.1)

## 2017-03-01 LAB — CBC
HCT: 50.5 % (ref 39.0–52.0)
Hemoglobin: 17.4 g/dL — ABNORMAL HIGH (ref 13.0–17.0)
MCH: 30.6 pg (ref 26.0–34.0)
MCHC: 34.5 g/dL (ref 30.0–36.0)
MCV: 88.9 fL (ref 78.0–100.0)
PLATELETS: 247 10*3/uL (ref 150–400)
RBC: 5.68 MIL/uL (ref 4.22–5.81)
RDW: 12.8 % (ref 11.5–15.5)
WBC: 19.2 10*3/uL — AB (ref 4.0–10.5)

## 2017-03-01 LAB — LIPASE, BLOOD: LIPASE: 44 U/L (ref 11–51)

## 2017-03-01 MED ORDER — PANTOPRAZOLE SODIUM 40 MG IV SOLR
40.0000 mg | Freq: Once | INTRAVENOUS | Status: AC
Start: 2017-03-01 — End: 2017-03-01
  Administered 2017-03-01: 40 mg via INTRAVENOUS
  Filled 2017-03-01: qty 40

## 2017-03-01 MED ORDER — ONDANSETRON HCL 4 MG/2ML IJ SOLN
INTRAMUSCULAR | Status: AC
  Filled 2017-03-01: qty 2

## 2017-03-01 MED ORDER — SODIUM CHLORIDE 0.9 % IV BOLUS (SEPSIS): 1000.0000 mL | Freq: Once | INTRAVENOUS | Status: DC

## 2017-03-01 MED ORDER — SODIUM CHLORIDE 0.9 % IV BOLUS (SEPSIS)
1000.0000 mL | Freq: Once | INTRAVENOUS | Status: AC
  Administered 2017-03-01: 1000 mL via INTRAVENOUS

## 2017-03-01 MED ORDER — ONDANSETRON HCL 8 MG PO TABS: 8.0000 mg | ORAL_TABLET | ORAL | 0 refills | Status: DC | PRN

## 2017-03-01 MED ORDER — IOPAMIDOL (ISOVUE-300) INJECTION 61%
100.0000 mL | Freq: Once | INTRAVENOUS | Status: AC | PRN
  Administered 2017-03-01: 100 mL via INTRAVENOUS

## 2017-03-01 MED ORDER — ONDANSETRON HCL 4 MG/2ML IJ SOLN
4.0000 mg | Freq: Once | INTRAMUSCULAR | Status: AC | PRN
  Administered 2017-03-01: 4 mg via INTRAVENOUS

## 2017-03-01 MED ORDER — HYDROMORPHONE HCL 1 MG/ML IJ SOLN
1.0000 mg | Freq: Once | INTRAMUSCULAR | Status: AC
  Administered 2017-03-01: 1 mg via INTRAVENOUS
  Filled 2017-03-01: qty 1
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
## 2017-03-01 NOTE — ED Triage Notes (Signed)
Pt reports mid abdominal pain that started 3 days ago. Pt reports that he has been vomiting off and on. Pain increased this morning. Pt is thrashing around in transport chair in triage

## 2017-03-01 NOTE — Discharge Instructions (Signed)
You have a small 4 mm nodule in your right lung. It is recommended that she get a repeat CT scan of your chest in 12 months. Otherwise the abdominal scan was normal. Prescription for antibiotic.

## 2017-05-29 ENCOUNTER — Emergency Department (HOSPITAL_COMMUNITY)
Admission: EM | Admit: 2017-05-29 | Discharge: 2017-05-29 | Disposition: A | Discharge status: Home / Self Care | Payer: Self-pay | Attending: Emergency Medicine | Admitting: Emergency Medicine

## 2017-05-29 ENCOUNTER — Emergency Department (HOSPITAL_COMMUNITY): Payer: Self-pay

## 2017-05-29 ENCOUNTER — Encounter (HOSPITAL_COMMUNITY): Payer: Self-pay | Admitting: Emergency Medicine

## 2017-05-29 DIAGNOSIS — R1013 Epigastric pain: Secondary | ICD-10-CM | POA: Insufficient documentation

## 2017-05-29 DIAGNOSIS — R109 Unspecified abdominal pain: Secondary | ICD-10-CM

## 2017-05-29 DIAGNOSIS — R112 Nausea with vomiting, unspecified: Secondary | ICD-10-CM | POA: Insufficient documentation

## 2017-05-29 DIAGNOSIS — F17210 Nicotine dependence, cigarettes, uncomplicated: Secondary | ICD-10-CM | POA: Insufficient documentation

## 2017-05-29 DIAGNOSIS — Z79899 Other long term (current) drug therapy: Secondary | ICD-10-CM | POA: Insufficient documentation

## 2017-05-29 DIAGNOSIS — R1084 Generalized abdominal pain: Secondary | ICD-10-CM | POA: Insufficient documentation

## 2017-05-29 LAB — URINALYSIS, ROUTINE W REFLEX MICROSCOPIC
Bacteria, UA: NONE SEEN
Bilirubin Urine: NEGATIVE
Glucose, UA: NEGATIVE mg/dL
Ketones, ur: 5 mg/dL — AB
Leukocytes, UA: NEGATIVE
Nitrite: NEGATIVE
Protein, ur: 30 mg/dL — AB
Specific Gravity, Urine: 1.026 (ref 1.005–1.030)
pH: 7 (ref 5.0–8.0)

## 2017-05-29 LAB — CBC
HCT: 51.5 % (ref 39.0–52.0)
Hemoglobin: 17.6 g/dL — ABNORMAL HIGH (ref 13.0–17.0)
MCH: 30.5 pg (ref 26.0–34.0)
MCHC: 34.2 g/dL (ref 30.0–36.0)
MCV: 89.3 fL (ref 78.0–100.0)
Platelets: 240 10*3/uL (ref 150–400)
RBC: 5.77 MIL/uL (ref 4.22–5.81)
RDW: 13.1 % (ref 11.5–15.5)
WBC: 14 10*3/uL — ABNORMAL HIGH (ref 4.0–10.5)

## 2017-05-29 LAB — COMPREHENSIVE METABOLIC PANEL
ALT: 20 U/L (ref 17–63)
AST: 22 U/L (ref 15–41)
Albumin: 5 g/dL (ref 3.5–5.0)
Alkaline Phosphatase: 54 U/L (ref 38–126)
Anion gap: 13 (ref 5–15)
BUN: 13 mg/dL (ref 6–20)
CO2: 26 mmol/L (ref 22–32)
Calcium: 10.3 mg/dL (ref 8.9–10.3)
Chloride: 99 mmol/L — ABNORMAL LOW (ref 101–111)
Creatinine, Ser: 0.98 mg/dL (ref 0.61–1.24)
GFR calc Af Amer: >60 mL/min (ref >60)
GFR calc non Af Amer: >60 mL/min (ref >60)
Glucose, Bld: 115 mg/dL — ABNORMAL HIGH (ref 65–99)
Potassium: 3.7 mmol/L (ref 3.5–5.1)
Sodium: 138 mmol/L (ref 135–145)
Total Bilirubin: 1.5 mg/dL — ABNORMAL HIGH (ref 0.3–1.2)
Total Protein: 8.6 g/dL — ABNORMAL HIGH (ref 6.5–8.1)

## 2017-05-29 LAB — LIPASE, BLOOD: Lipase: 39 U/L (ref 11–51)

## 2017-05-29 MED ORDER — PROMETHAZINE HCL 25 MG RE SUPP: 25.0000 mg | Freq: Four times a day (QID) | RECTAL | 0 refills | Status: DC | PRN

## 2017-05-29 MED ORDER — HYDROMORPHONE HCL 1 MG/ML IJ SOLN
1.0000 mg | Freq: Once | INTRAMUSCULAR | Status: AC
  Administered 2017-05-29: 1 mg via INTRAVENOUS
  Filled 2017-05-29: qty 1

## 2017-05-29 MED ORDER — FAMOTIDINE IN NACL 20-0.9 MG/50ML-% IV SOLN
20.0000 mg | Freq: Once | INTRAVENOUS | Status: AC
  Administered 2017-05-29: 20 mg via INTRAVENOUS
  Filled 2017-05-29: qty 50

## 2017-05-29 MED ORDER — RANITIDINE HCL 150 MG PO TABS
150.0000 mg | ORAL_TABLET | Freq: Two times a day (BID) | ORAL | 0 refills | Status: DC
Start: 2017-05-29 — End: 2024-03-30

## 2017-05-29 MED ORDER — SODIUM CHLORIDE 0.9 % IV BOLUS (SEPSIS)
1000.0000 mL | Freq: Once | INTRAVENOUS | Status: AC
  Administered 2017-05-29: 1000 mL via INTRAVENOUS

## 2017-05-29 MED ORDER — GI COCKTAIL ~~LOC~~
30.0000 mL | Freq: Once | ORAL | Status: AC
  Administered 2017-05-29: 30 mL via ORAL
  Filled 2017-05-29: qty 30

## 2017-05-29 MED ORDER — ONDANSETRON HCL 4 MG/2ML IJ SOLN
4.0000 mg | Freq: Once | INTRAMUSCULAR | Status: AC
  Administered 2017-05-29: 4 mg via INTRAVENOUS
  Filled 2017-05-29: qty 2

## 2017-05-29 MED ORDER — PROMETHAZINE HCL 25 MG PO TABS: 25.0000 mg | ORAL_TABLET | Freq: Four times a day (QID) | ORAL | 0 refills | Status: DC | PRN

## 2017-05-29 NOTE — ED Notes (Signed)
Pt tolerating water, peanut butter and crackers.

## 2017-05-29 NOTE — ED Triage Notes (Signed)
Pt of Dr Wilhemina Bonitoon Deigo Reports abd pain Smoker of both cigarettes and THC Has been here several times before for same

## 2017-05-29 NOTE — ED Triage Notes (Signed)
Patient complaining of abdominal pain and vomiting x 2 days.  
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
## 2017-06-01 LAB — H. PYLORI ANTIBODY, IGG: H Pylori IgG: <0.8 Index Value (ref 0.00–0.79)

## 2017-07-04 ENCOUNTER — Emergency Department (HOSPITAL_COMMUNITY)
Admission: EM | Admit: 2017-07-04 | Discharge: 2017-07-04 | Disposition: A | Discharge status: Home / Self Care | Payer: Self-pay | Attending: Emergency Medicine | Admitting: Emergency Medicine

## 2017-07-04 ENCOUNTER — Encounter (HOSPITAL_COMMUNITY): Payer: Self-pay | Admitting: *Deleted

## 2017-07-04 DIAGNOSIS — R1115 Cyclical vomiting syndrome unrelated to migraine: Secondary | ICD-10-CM

## 2017-07-04 DIAGNOSIS — G43A0 Cyclical vomiting, not intractable: Secondary | ICD-10-CM | POA: Insufficient documentation

## 2017-07-04 DIAGNOSIS — Z79899 Other long term (current) drug therapy: Secondary | ICD-10-CM | POA: Insufficient documentation

## 2017-07-04 DIAGNOSIS — F17210 Nicotine dependence, cigarettes, uncomplicated: Secondary | ICD-10-CM | POA: Insufficient documentation

## 2017-07-04 LAB — CBC WITH DIFFERENTIAL/PLATELET
Basophils Absolute: 0 10*3/uL (ref 0.0–0.1)
Basophils Relative: 0 %
EOS PCT: 0 %
Eosinophils Absolute: 0 10*3/uL (ref 0.0–0.7)
HCT: 47.7 % (ref 39.0–52.0)
Hemoglobin: 16.5 g/dL (ref 13.0–17.0)
LYMPHS ABS: 1.1 10*3/uL (ref 0.7–4.0)
LYMPHS PCT: 7 %
MCH: 30.4 pg (ref 26.0–34.0)
MCHC: 34.6 g/dL (ref 30.0–36.0)
MCV: 88 fL (ref 78.0–100.0)
Monocytes Absolute: 0.5 10*3/uL (ref 0.1–1.0)
Monocytes Relative: 3 %
Neutro Abs: 15.3 10*3/uL — ABNORMAL HIGH (ref 1.7–7.7)
Neutrophils Relative %: 90 %
Platelets: 225 10*3/uL (ref 150–400)
RBC: 5.42 MIL/uL (ref 4.22–5.81)
RDW: 12.5 % (ref 11.5–15.5)
WBC: 17 10*3/uL — AB (ref 4.0–10.5)

## 2017-07-04 LAB — COMPREHENSIVE METABOLIC PANEL
ALBUMIN: 4.4 g/dL (ref 3.5–5.0)
ALT: 19 U/L (ref 17–63)
AST: 33 U/L (ref 15–41)
Alkaline Phosphatase: 59 U/L (ref 38–126)
Anion gap: 14 (ref 5–15)
BUN: 9 mg/dL (ref 6–20)
CHLORIDE: 104 mmol/L (ref 101–111)
CO2: 22 mmol/L (ref 22–32)
Calcium: 9.8 mg/dL (ref 8.9–10.3)
Creatinine, Ser: 0.99 mg/dL (ref 0.61–1.24)
GFR calc Af Amer: >60 mL/min (ref >60)
GLUCOSE: 115 mg/dL — AB (ref 65–99)
POTASSIUM: 3.6 mmol/L (ref 3.5–5.1)
Sodium: 140 mmol/L (ref 135–145)
Total Bilirubin: 1.2 mg/dL (ref 0.3–1.2)
Total Protein: 8 g/dL (ref 6.5–8.1)

## 2017-07-04 MED ORDER — PROMETHAZINE HCL 25 MG/ML IJ SOLN
25.0000 mg | Freq: Once | INTRAMUSCULAR | Status: AC
  Administered 2017-07-04: 25 mg via INTRAMUSCULAR
  Filled 2017-07-04: qty 1

## 2017-07-04 MED ORDER — ONDANSETRON 4 MG PO TBDP: 4.0000 mg | ORAL_TABLET | Freq: Three times a day (TID) | ORAL | 0 refills | Status: DC | PRN

## 2017-07-04 MED ORDER — SODIUM CHLORIDE 0.9 % IV BOLUS (SEPSIS)
1000.0000 mL | Freq: Once | INTRAVENOUS | Status: AC
  Administered 2017-07-04: 1000 mL via INTRAVENOUS

## 2017-07-04 MED ORDER — ONDANSETRON HCL 4 MG/2ML IJ SOLN
4.0000 mg | Freq: Once | INTRAMUSCULAR | Status: AC
  Administered 2017-07-04: 4 mg via INTRAVENOUS
  Filled 2017-07-04: qty 2

## 2017-07-04 MED ORDER — DICYCLOMINE HCL 10 MG PO CAPS
ORAL_CAPSULE | ORAL | Status: AC
  Filled 2017-07-04: qty 2

## 2017-07-04 MED ORDER — PROMETHAZINE HCL 25 MG RE SUPP: 25.0000 mg | Freq: Four times a day (QID) | RECTAL | 0 refills | Status: DC | PRN

## 2017-07-04 MED ORDER — DICYCLOMINE HCL 10 MG PO CAPS
20.0000 mg | ORAL_CAPSULE | Freq: Once | ORAL | Status: AC
  Administered 2017-07-04: 20 mg via ORAL

## 2017-07-04 MED ORDER — HALOPERIDOL LACTATE 5 MG/ML IJ SOLN
5.0000 mg | Freq: Once | INTRAMUSCULAR | Status: AC
  Administered 2017-07-04: 5 mg via INTRAMUSCULAR
  Filled 2017-07-04: qty 1
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
## 2017-07-04 NOTE — Discharge Instructions (Signed)

## 2017-10-11 IMAGING — DX DG HAND COMPLETE 3+V*L*
3 series · 3 of 3 positions shown · non-contrast
Comparison: No priors.

CLINICAL DATA: 40-year-old male with insect bite on the left-hand
complaining of swelling and redness.

EXAM:
LEFT HAND - COMPLETE 3+ VIEW

[hand pa]
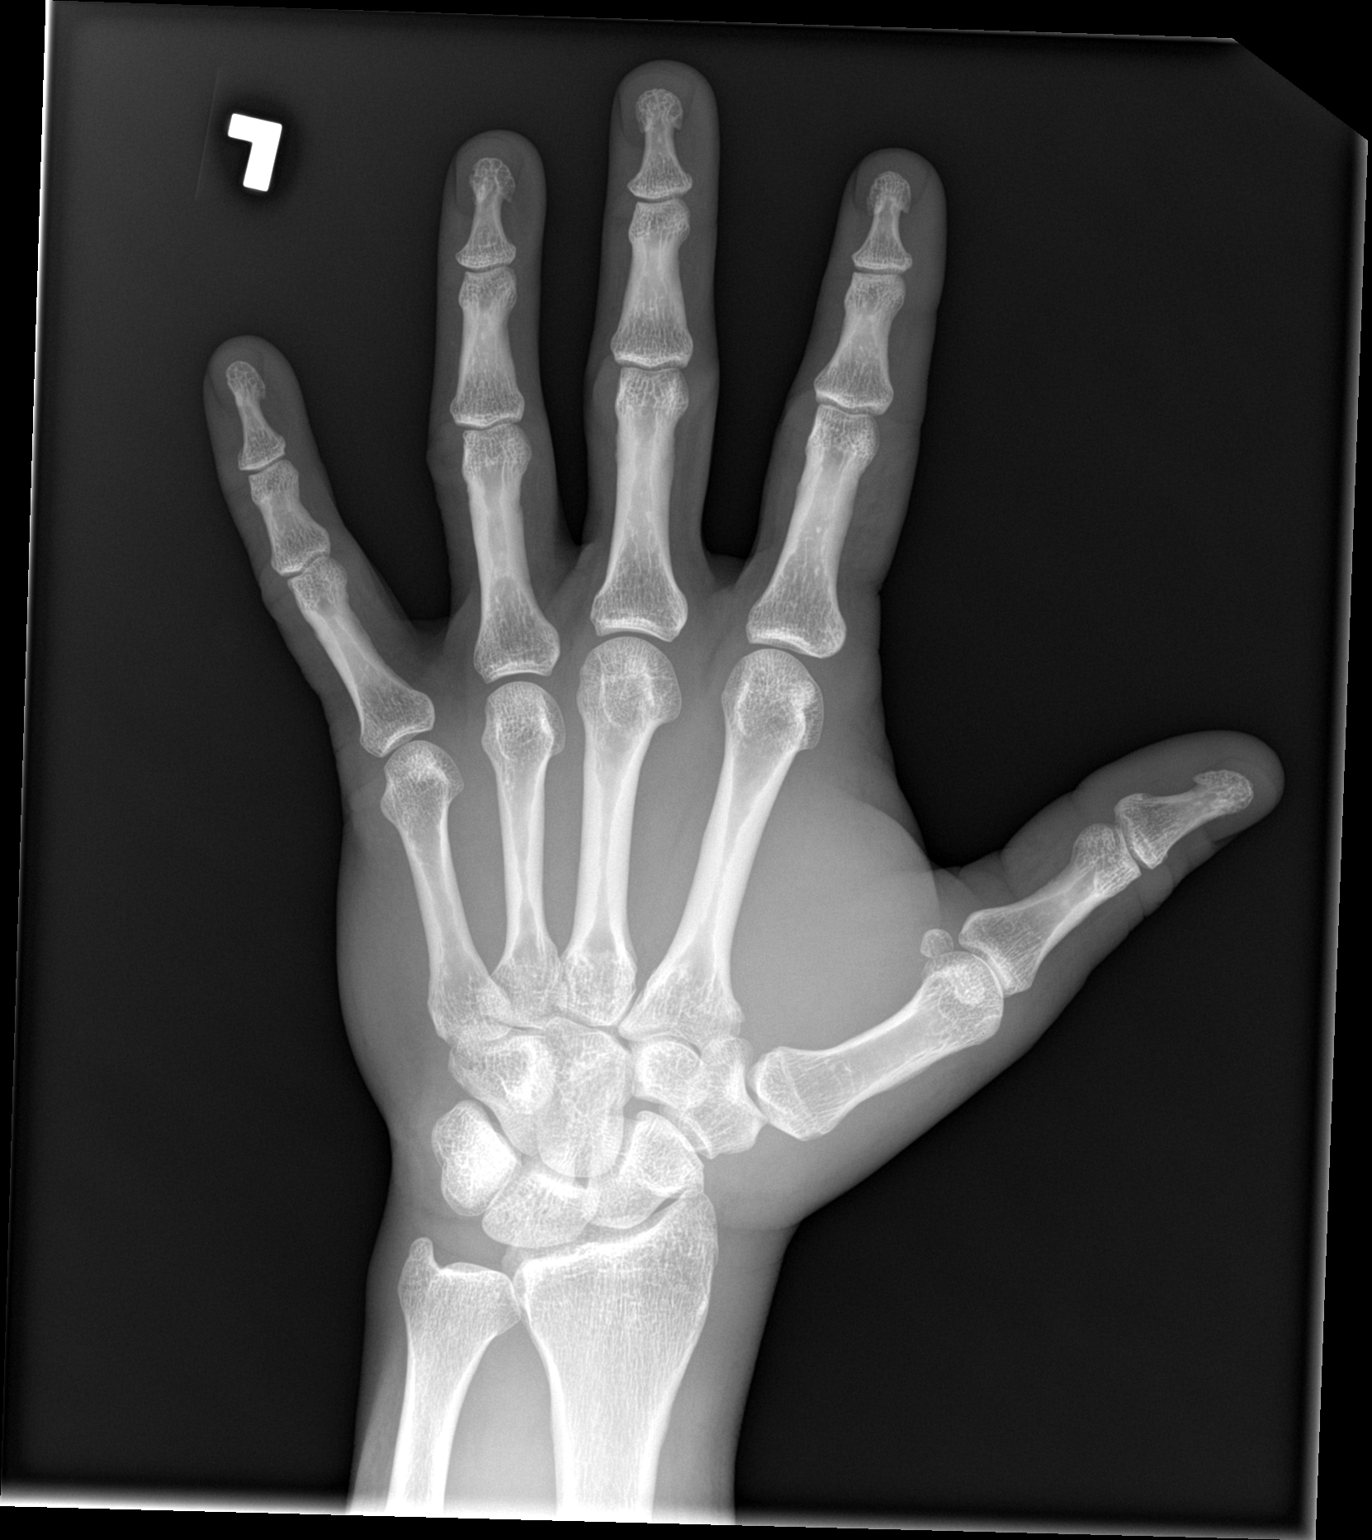

[hand obl]
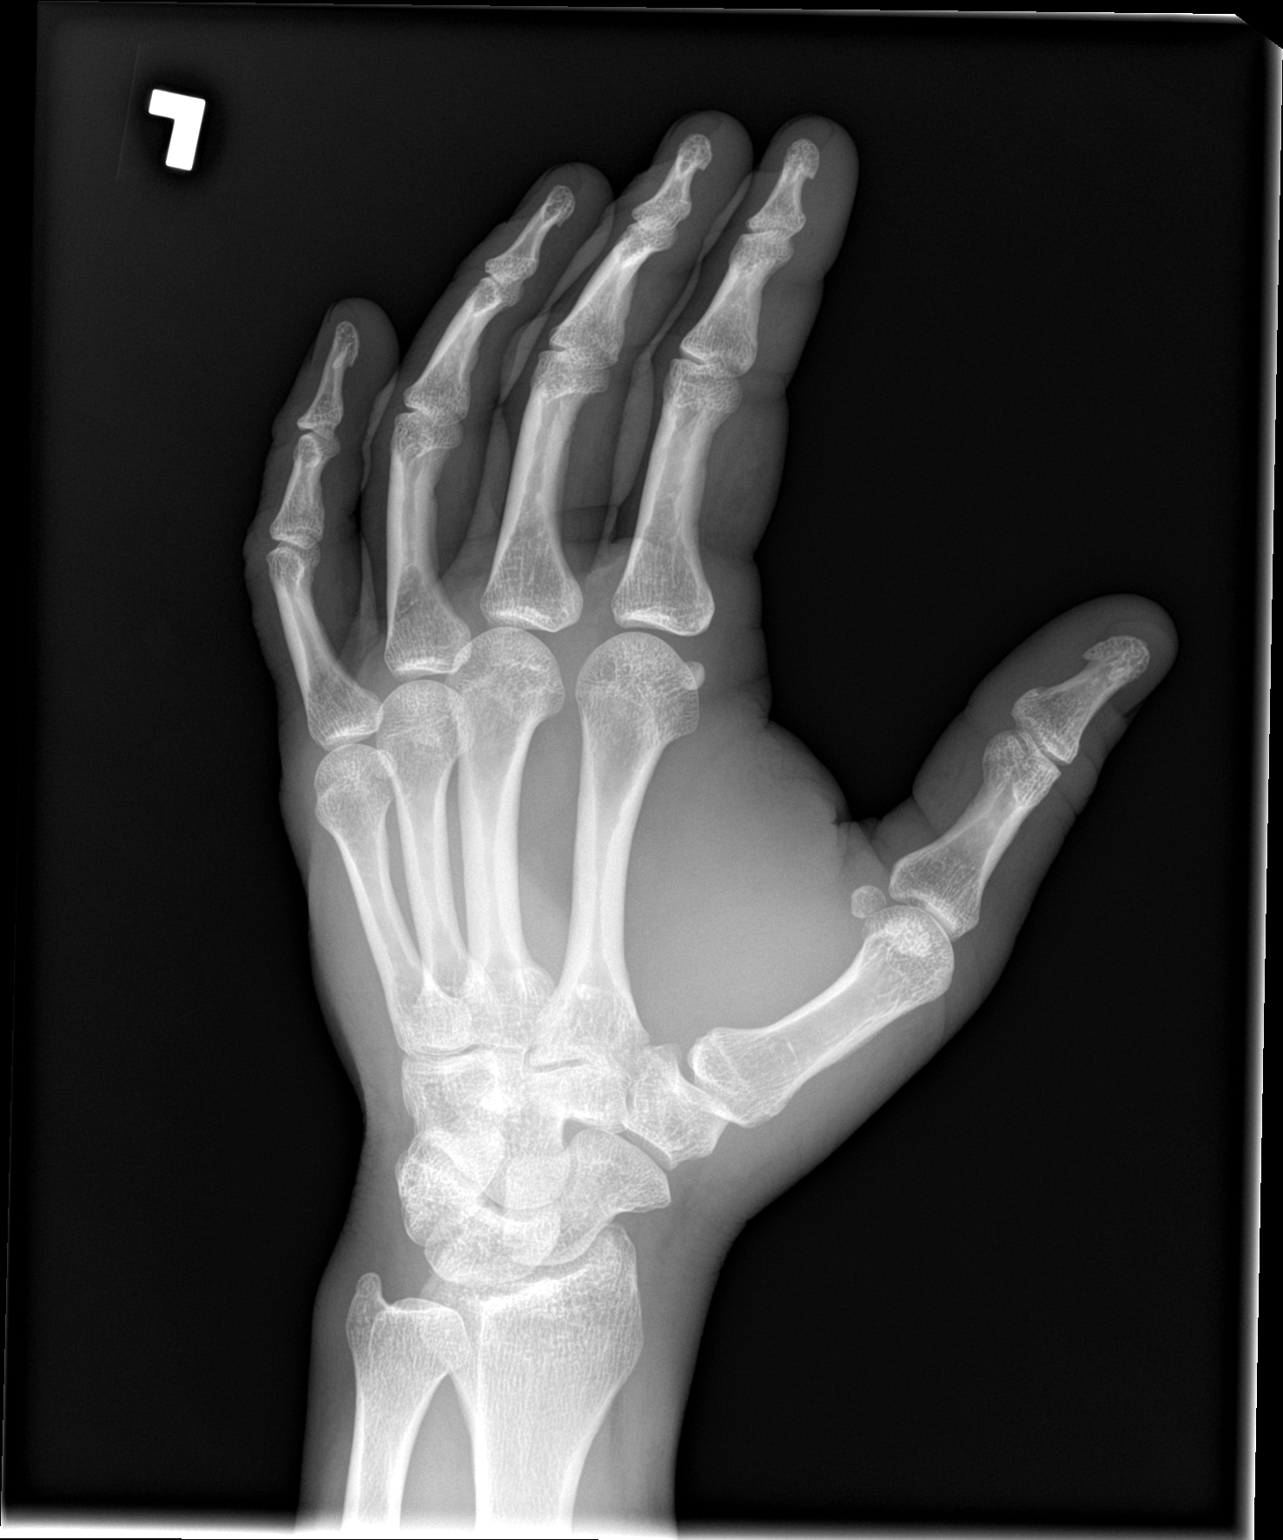

[hand lat]
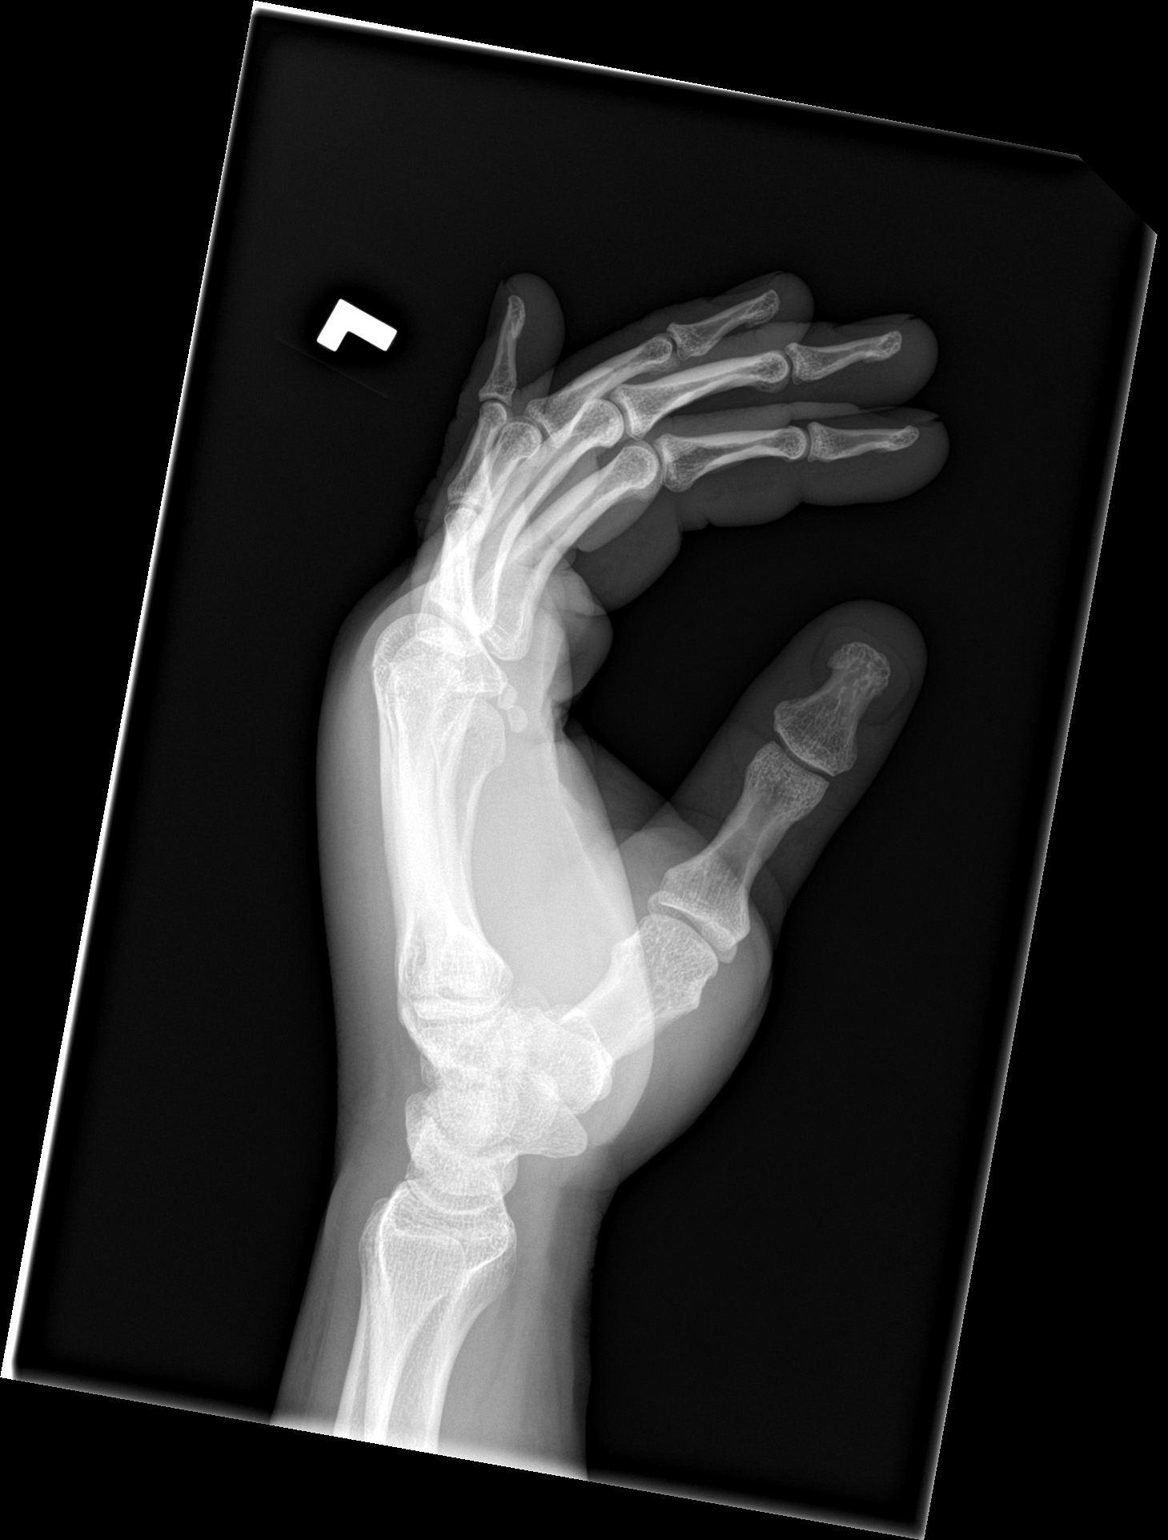

[3 of 3 positions shown; findings below may reference images not displayed]

FINDINGS: There is no evidence of fracture or dislocation. There is no
evidence of arthropathy or other focal bone abnormality. Soft
tissues are unremarkable. Specifically, no retained radiopaque
foreign body.
IMPRESSION: Negative.

## 2017-12-27 ENCOUNTER — Encounter (HOSPITAL_COMMUNITY): Payer: Self-pay | Admitting: Emergency Medicine

## 2017-12-27 ENCOUNTER — Emergency Department (HOSPITAL_COMMUNITY)
Admission: EM | Admit: 2017-12-27 | Discharge: 2017-12-27 | Disposition: A | Discharge status: Home / Self Care | Payer: Self-pay | Attending: Emergency Medicine | Admitting: Emergency Medicine

## 2017-12-27 ENCOUNTER — Other Ambulatory Visit: Payer: Self-pay

## 2017-12-27 DIAGNOSIS — Z79899 Other long term (current) drug therapy: Secondary | ICD-10-CM | POA: Insufficient documentation

## 2017-12-27 DIAGNOSIS — F17210 Nicotine dependence, cigarettes, uncomplicated: Secondary | ICD-10-CM | POA: Insufficient documentation

## 2017-12-27 DIAGNOSIS — R112 Nausea with vomiting, unspecified: Secondary | ICD-10-CM | POA: Insufficient documentation

## 2017-12-27 LAB — COMPREHENSIVE METABOLIC PANEL
ALT: 28 U/L (ref 17–63)
ANION GAP: 13 (ref 5–15)
AST: 30 U/L (ref 15–41)
Albumin: 4.9 g/dL (ref 3.5–5.0)
Alkaline Phosphatase: 68 U/L (ref 38–126)
BILIRUBIN TOTAL: 0.9 mg/dL (ref 0.3–1.2)
BUN: 10 mg/dL (ref 6–20)
CHLORIDE: 103 mmol/L (ref 101–111)
CO2: 24 mmol/L (ref 22–32)
Calcium: 10.7 mg/dL — ABNORMAL HIGH (ref 8.9–10.3)
Creatinine, Ser: 1.02 mg/dL (ref 0.61–1.24)
GFR calc Af Amer: >60 mL/min (ref >60)
GFR calc non Af Amer: >60 mL/min (ref >60)
GLUCOSE: 126 mg/dL — AB (ref 65–99)
Potassium: 4.4 mmol/L (ref 3.5–5.1)
SODIUM: 140 mmol/L (ref 135–145)
TOTAL PROTEIN: 8.6 g/dL — AB (ref 6.5–8.1)

## 2017-12-27 LAB — CBC
HCT: 52.2 % — ABNORMAL HIGH (ref 39.0–52.0)
HEMOGLOBIN: 17.1 g/dL — AB (ref 13.0–17.0)
MCH: 29.6 pg (ref 26.0–34.0)
MCHC: 32.8 g/dL (ref 30.0–36.0)
MCV: 90.3 fL (ref 78.0–100.0)
Platelets: 218 10*3/uL (ref 150–400)
RBC: 5.78 MIL/uL (ref 4.22–5.81)
RDW: 12.7 % (ref 11.5–15.5)
WBC: 14.3 10*3/uL — ABNORMAL HIGH (ref 4.0–10.5)

## 2017-12-27 LAB — LIPASE, BLOOD: LIPASE: 56 U/L — AB (ref 11–51)

## 2017-12-27 MED ORDER — LORAZEPAM 2 MG/ML IJ SOLN
1.0000 mg | Freq: Once | INTRAMUSCULAR | Status: AC
  Administered 2017-12-27: 1 mg via INTRAVENOUS
  Filled 2017-12-27: qty 1

## 2017-12-27 MED ORDER — SODIUM CHLORIDE 0.9 % IV BOLUS (SEPSIS)
1000.0000 mL | Freq: Once | INTRAVENOUS | Status: AC
  Administered 2017-12-27: 1000 mL via INTRAVENOUS

## 2017-12-27 MED ORDER — PROMETHAZINE HCL 25 MG RE SUPP
25.0000 mg | Freq: Four times a day (QID) | RECTAL | Status: DC | PRN
  Administered 2017-12-27: 25 mg via RECTAL
  Filled 2017-12-27: qty 1

## 2017-12-27 MED ORDER — PANTOPRAZOLE SODIUM 40 MG IV SOLR
40.0000 mg | Freq: Once | INTRAVENOUS | Status: AC
  Administered 2017-12-27: 40 mg via INTRAVENOUS
  Filled 2017-12-27: qty 40

## 2017-12-27 MED ORDER — ONDANSETRON HCL 4 MG/2ML IJ SOLN
4.0000 mg | Freq: Once | INTRAMUSCULAR | Status: AC | PRN
  Administered 2017-12-27: 4 mg via INTRAVENOUS
  Filled 2017-12-27: qty 2
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
## 2017-12-27 NOTE — ED Triage Notes (Signed)
Pt c/o multiple episodes of emesis w/o diarrhea that began around 0730 this morning. Denies fever. States this happens a few times a month.

## 2017-12-27 NOTE — ED Notes (Addendum)
Pt upset that he isn't going straight to the back. States I want to go home. Family attempting to convince patient to stay. Family wheeling patient back to waiting room.

## 2017-12-27 NOTE — Discharge Instructions (Signed)
Increase fluids.  Take your Phenergan as needed.  Follow-up with your primary care doctor.

## 2018-04-05 ENCOUNTER — Emergency Department (HOSPITAL_COMMUNITY)
Admission: EM | Admit: 2018-04-05 | Discharge: 2018-04-05 | Disposition: A | Discharge status: Home / Self Care | Payer: Self-pay | Attending: Emergency Medicine | Admitting: Emergency Medicine

## 2018-04-05 ENCOUNTER — Other Ambulatory Visit: Payer: Self-pay

## 2018-04-05 ENCOUNTER — Encounter (HOSPITAL_COMMUNITY): Payer: Self-pay | Admitting: Emergency Medicine

## 2018-04-05 DIAGNOSIS — F17210 Nicotine dependence, cigarettes, uncomplicated: Secondary | ICD-10-CM | POA: Insufficient documentation

## 2018-04-05 DIAGNOSIS — R112 Nausea with vomiting, unspecified: Secondary | ICD-10-CM | POA: Insufficient documentation

## 2018-04-05 DIAGNOSIS — R1013 Epigastric pain: Secondary | ICD-10-CM

## 2018-04-05 DIAGNOSIS — R109 Unspecified abdominal pain: Secondary | ICD-10-CM | POA: Insufficient documentation

## 2018-04-05 DIAGNOSIS — R1114 Bilious vomiting: Secondary | ICD-10-CM

## 2018-04-05 DIAGNOSIS — Z79899 Other long term (current) drug therapy: Secondary | ICD-10-CM | POA: Insufficient documentation

## 2018-04-05 LAB — COMPREHENSIVE METABOLIC PANEL
ALBUMIN: 4.7 g/dL (ref 3.5–5.0)
ALT: 17 U/L (ref 17–63)
AST: 21 U/L (ref 15–41)
Alkaline Phosphatase: 61 U/L (ref 38–126)
Anion gap: 11 (ref 5–15)
BUN: 8 mg/dL (ref 6–20)
CHLORIDE: 109 mmol/L (ref 101–111)
CO2: 23 mmol/L (ref 22–32)
CREATININE: 0.88 mg/dL (ref 0.61–1.24)
Calcium: 10.3 mg/dL (ref 8.9–10.3)
GFR calc Af Amer: >60 mL/min (ref >60)
GLUCOSE: 120 mg/dL — AB (ref 65–99)
POTASSIUM: 3.6 mmol/L (ref 3.5–5.1)
SODIUM: 143 mmol/L (ref 135–145)
Total Bilirubin: 1 mg/dL (ref 0.3–1.2)
Total Protein: 8.1 g/dL (ref 6.5–8.1)

## 2018-04-05 LAB — CBC WITH DIFFERENTIAL/PLATELET
BASOS PCT: 0 %
Basophils Absolute: 0 10*3/uL (ref 0.0–0.1)
Eosinophils Absolute: 0 10*3/uL (ref 0.0–0.7)
Eosinophils Relative: 0 %
HEMATOCRIT: 47.9 % (ref 39.0–52.0)
Hemoglobin: 16.1 g/dL (ref 13.0–17.0)
LYMPHS ABS: 0.8 10*3/uL (ref 0.7–4.0)
LYMPHS PCT: 6 %
MCH: 29.3 pg (ref 26.0–34.0)
MCHC: 33.6 g/dL (ref 30.0–36.0)
MCV: 87.2 fL (ref 78.0–100.0)
MONO ABS: 0.4 10*3/uL (ref 0.1–1.0)
MONOS PCT: 4 %
NEUTROS ABS: 11.3 10*3/uL — AB (ref 1.7–7.7)
NEUTROS PCT: 90 %
Platelets: 203 10*3/uL (ref 150–400)
RBC: 5.49 MIL/uL (ref 4.22–5.81)
RDW: 12.4 % (ref 11.5–15.5)
WBC: 12.5 10*3/uL — ABNORMAL HIGH (ref 4.0–10.5)

## 2018-04-05 LAB — ETHANOL

## 2018-04-05 LAB — LIPASE, BLOOD: Lipase: 59 U/L — ABNORMAL HIGH (ref 11–51)

## 2018-04-05 MED ORDER — SODIUM CHLORIDE 0.9 % IV BOLUS
1000.0000 mL | Freq: Once | INTRAVENOUS | Status: AC
  Administered 2018-04-05: 1000 mL via INTRAVENOUS

## 2018-04-05 MED ORDER — PROCHLORPERAZINE EDISYLATE 10 MG/2ML IJ SOLN
10.0000 mg | Freq: Once | INTRAMUSCULAR | Status: AC
  Administered 2018-04-05: 10 mg via INTRAVENOUS
  Filled 2018-04-05: qty 2

## 2018-04-05 NOTE — Discharge Instructions (Addendum)
As discussed, your evaluation today has been largely reassuring.  But, it is important that you monitor your condition carefully.  Otherwise, please follow-up with your physician for appropriate ongoing care. Please wait for a contact from our case management services to assist with your expiration of what you are eligible for in terms of health insurance.  You may attempt to use a Tiger balm, or another medication containing capsaicin, applied as a ointment on your abdomen, if you begin to feel another bout of similar symptoms. However, if you develop persistent symptoms or any other notable changes in your condition, please return here for evaluation.

## 2018-04-05 NOTE — ED Triage Notes (Signed)
Patient complaining of vomiting and abdominal pain starting this morning.
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
## 2018-04-07 NOTE — Care Management (Signed)
CM consult received for financial assistance with GI follow up. CM contacted pt at home and gave him the contact information for the Financial counselor. Instructed him he will need to complete application for the FAP which would help with cost of speciality f/u.

## 2018-05-10 ENCOUNTER — Encounter: Payer: Self-pay | Admitting: Orthopedic Surgery

## 2018-05-10 ENCOUNTER — Ambulatory Visit: Payer: Self-pay | Admitting: Orthopedic Surgery

## 2018-05-10 ENCOUNTER — Ambulatory Visit (INDEPENDENT_AMBULATORY_CARE_PROVIDER_SITE_OTHER): Payer: Self-pay

## 2018-05-10 VITALS — BP 141/80 | HR 80 | Ht 71.0 in | Wt 163.0 lb

## 2018-05-10 DIAGNOSIS — G8929 Other chronic pain: Secondary | ICD-10-CM

## 2018-05-10 DIAGNOSIS — M778 Other enthesopathies, not elsewhere classified: Secondary | ICD-10-CM

## 2018-05-10 DIAGNOSIS — M25512 Pain in left shoulder: Secondary | ICD-10-CM

## 2018-05-10 DIAGNOSIS — M7581 Other shoulder lesions, right shoulder: Secondary | ICD-10-CM

## 2018-05-10 DIAGNOSIS — M25511 Pain in right shoulder: Secondary | ICD-10-CM

## 2018-05-10 DIAGNOSIS — M7582 Other shoulder lesions, left shoulder: Secondary | ICD-10-CM
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
## 2018-08-09 ENCOUNTER — Ambulatory Visit: Payer: Self-pay | Admitting: Orthopedic Surgery

## 2018-08-23 ENCOUNTER — Ambulatory Visit: Payer: Self-pay | Admitting: Orthopedic Surgery

## 2018-08-30 ENCOUNTER — Ambulatory Visit: Payer: Self-pay | Admitting: Orthopedic Surgery

## 2018-08-30 ENCOUNTER — Encounter: Payer: Self-pay | Admitting: Orthopaedic Surgery

## 2018-11-06 IMAGING — US US ABDOMEN LIMITED
1 series · 14 of 25 positions shown · non-contrast
Comparison: CT abdomen and pelvis March 01, 2017

CLINICAL DATA: Right upper quadrant pain with nausea and vomiting

EXAM:
ULTRASOUND ABDOMEN LIMITED RIGHT UPPER QUADRANT

[Series 1: us abdomen limited · 0.19mm/px · 14 of 38 slices shown]
[im 1/38]
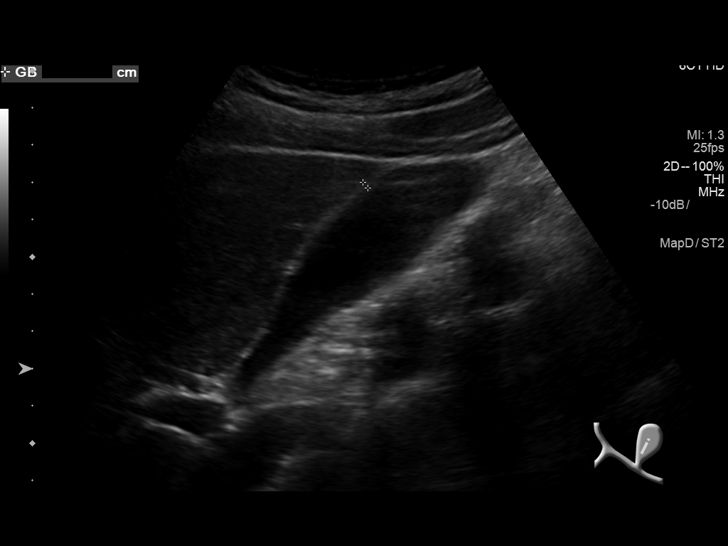
[im 4/38]
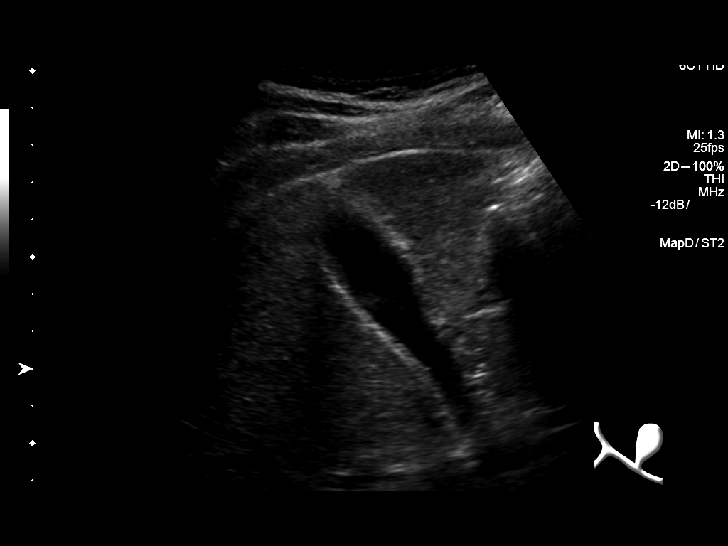
[im 7/38]
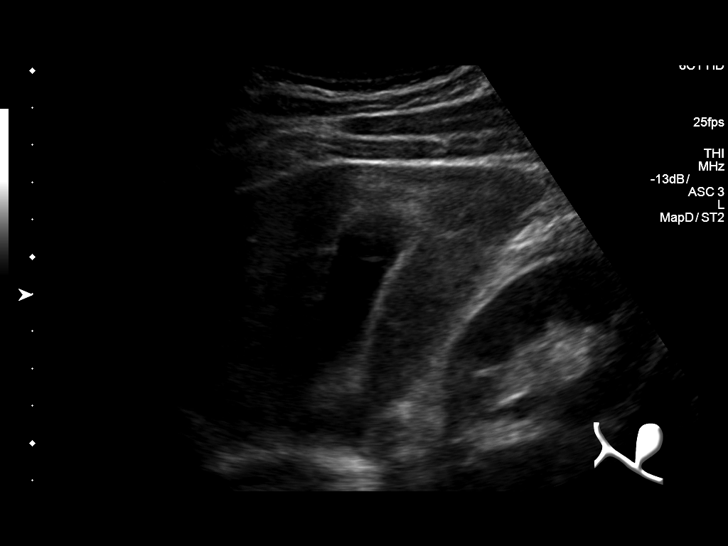
[im 10/38]
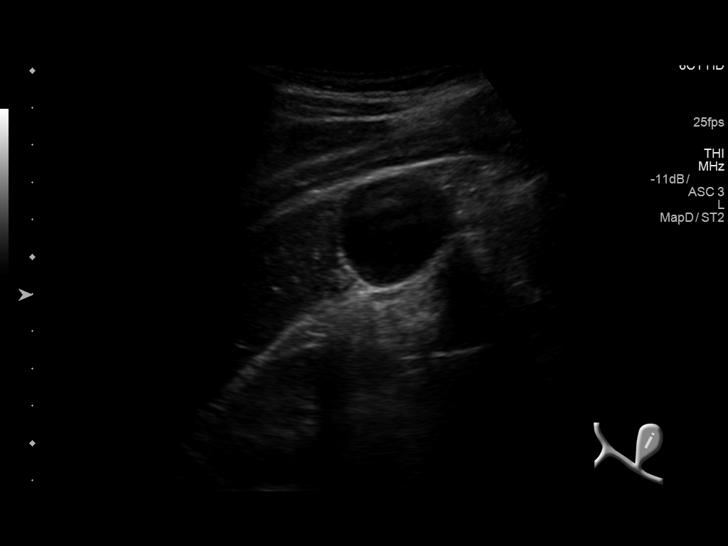
[im 13/38]
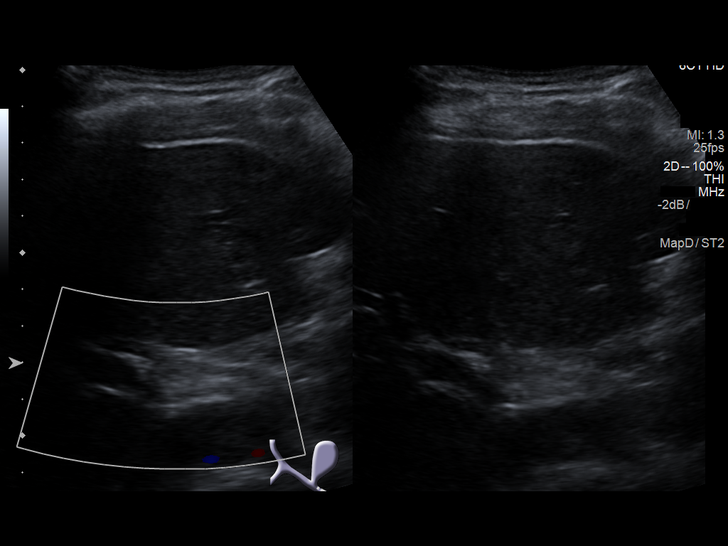
[im 14/38]
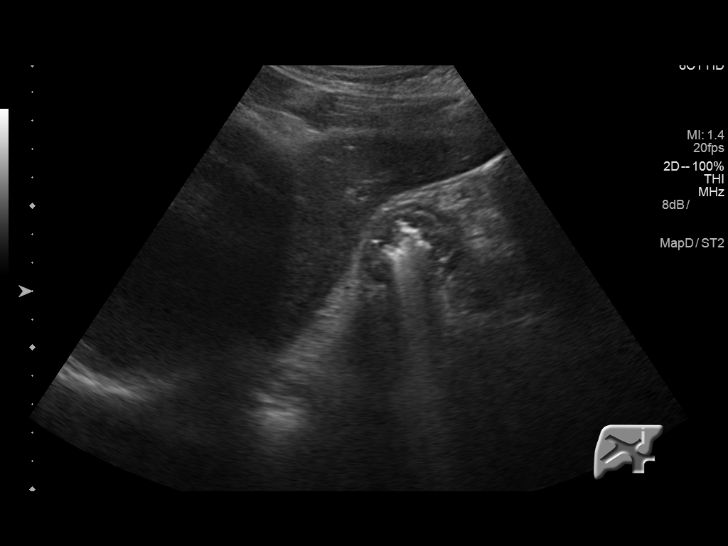
[im 17/38]
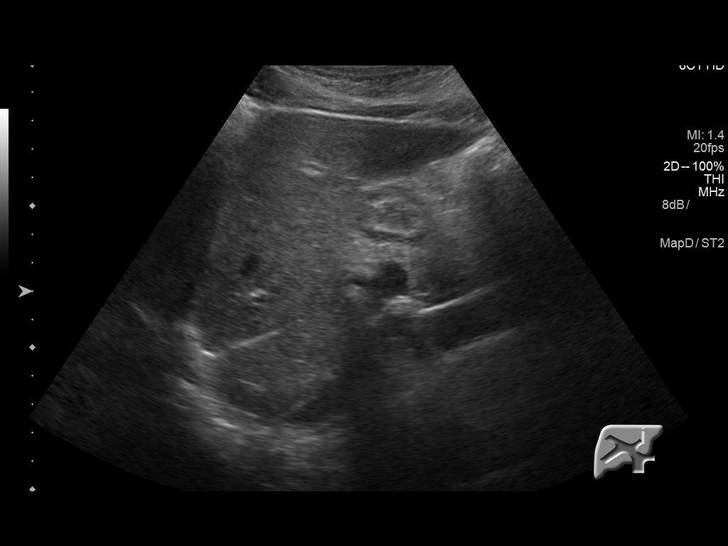
[im 21/38]
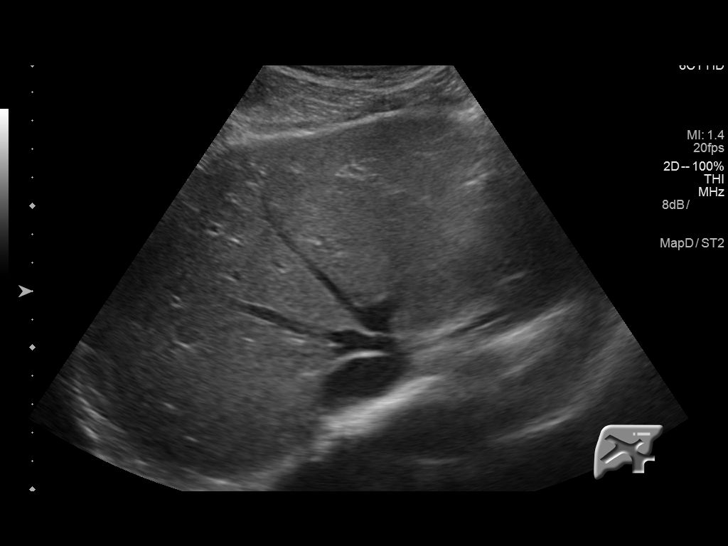
[im 24/38]
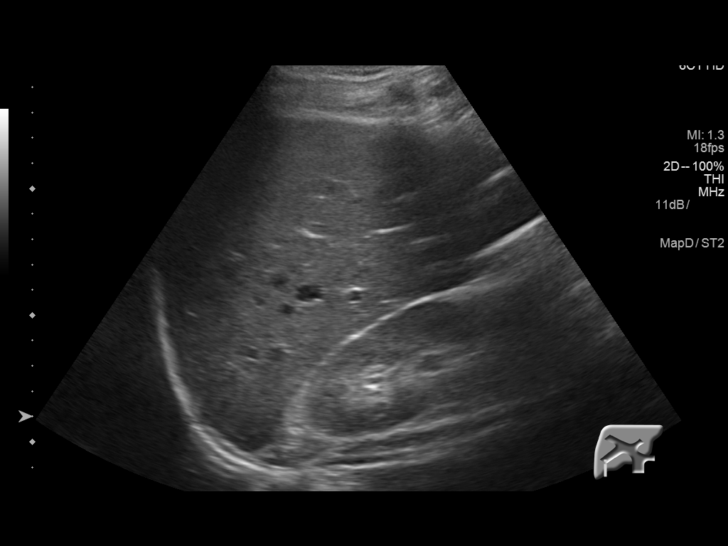
[im 25/38]
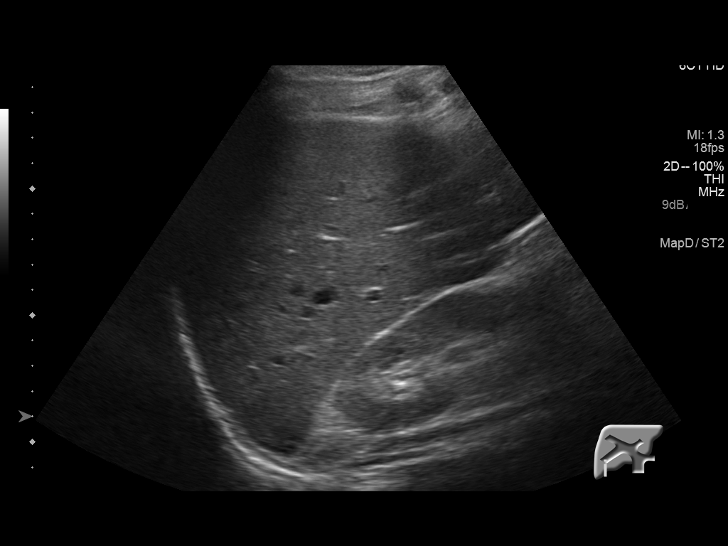
[im 28/38]
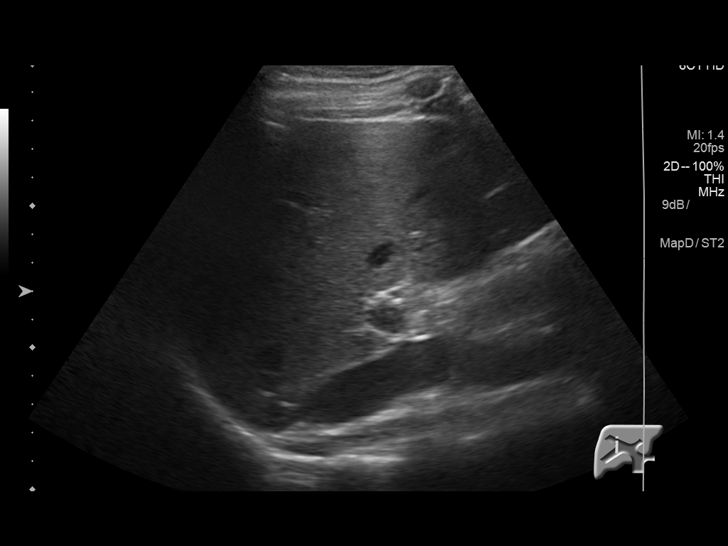
[im 31/38]
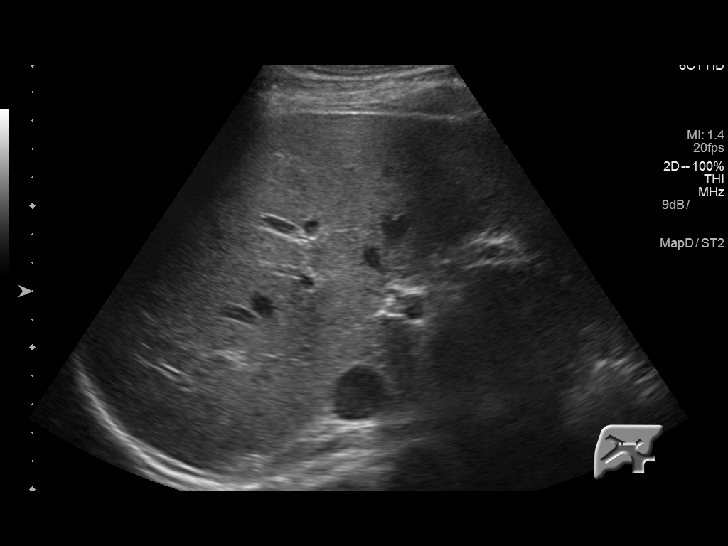
[im 34/38]
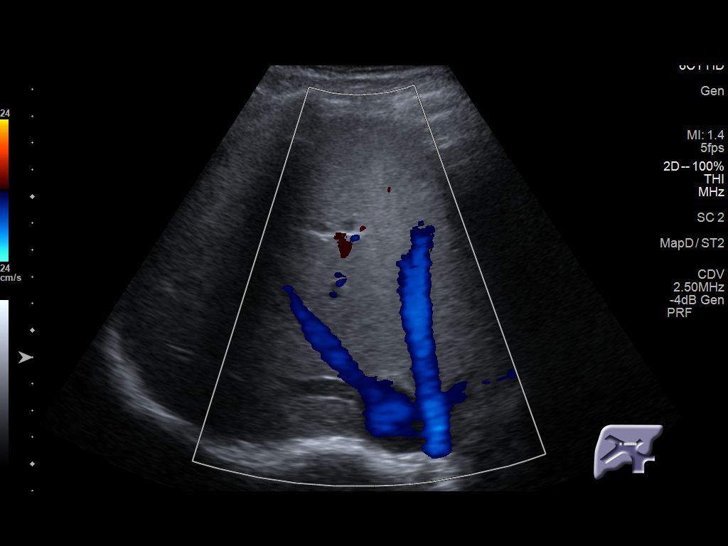
[im 38/38]
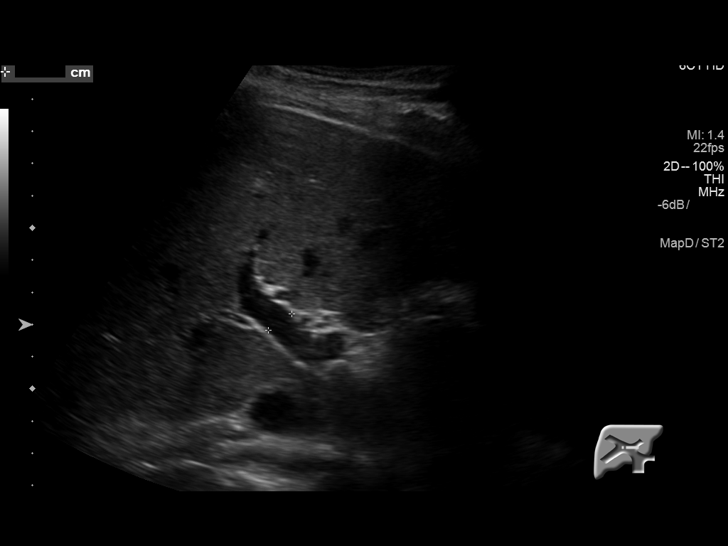

[14 of 25 positions shown; findings below may reference images not displayed]

FINDINGS: Gallbladder:

No gallstones or wall thickening visualized. There is no
pericholecystic fluid. No sonographic Murphy sign noted by
sonographer.

Common bile duct:

Diameter: 4 mm. No intrahepatic or extrahepatic biliary duct
dilatation.

Liver:

No focal lesion identified. Within normal limits in parenchymal
echogenicity. Flow in the portal vein is in the anatomic direction.
IMPRESSION: Study within normal limits.

## 2018-12-02 ENCOUNTER — Other Ambulatory Visit: Payer: Self-pay

## 2018-12-02 ENCOUNTER — Emergency Department (HOSPITAL_COMMUNITY)
Admission: EM | Admit: 2018-12-02 | Discharge: 2018-12-02 | Disposition: A | Discharge status: Home / Self Care | Payer: Self-pay | Attending: Emergency Medicine | Admitting: Emergency Medicine

## 2018-12-02 ENCOUNTER — Emergency Department (HOSPITAL_COMMUNITY): Payer: Self-pay

## 2018-12-02 ENCOUNTER — Encounter (HOSPITAL_COMMUNITY): Payer: Self-pay | Admitting: Emergency Medicine

## 2018-12-02 DIAGNOSIS — Z79899 Other long term (current) drug therapy: Secondary | ICD-10-CM | POA: Insufficient documentation

## 2018-12-02 DIAGNOSIS — R079 Chest pain, unspecified: Secondary | ICD-10-CM

## 2018-12-02 DIAGNOSIS — R55 Syncope and collapse: Secondary | ICD-10-CM

## 2018-12-02 DIAGNOSIS — F17210 Nicotine dependence, cigarettes, uncomplicated: Secondary | ICD-10-CM | POA: Insufficient documentation

## 2018-12-02 LAB — CBC WITH DIFFERENTIAL/PLATELET
Abs Immature Granulocytes: 0.03 10*3/uL (ref 0.00–0.07)
BASOS ABS: 0 10*3/uL (ref 0.0–0.1)
Basophils Relative: 0 %
Eosinophils Absolute: 0.2 10*3/uL (ref 0.0–0.5)
Eosinophils Relative: 2 %
HEMATOCRIT: 48.8 % (ref 39.0–52.0)
Hemoglobin: 15.3 g/dL (ref 13.0–17.0)
Immature Granulocytes: 0 %
LYMPHS ABS: 2.1 10*3/uL (ref 0.7–4.0)
Lymphocytes Relative: 18 %
MCH: 29.1 pg (ref 26.0–34.0)
MCHC: 31.4 g/dL (ref 30.0–36.0)
MCV: 92.8 fL (ref 80.0–100.0)
Monocytes Absolute: 0.9 10*3/uL (ref 0.1–1.0)
Monocytes Relative: 8 %
Neutro Abs: 8.2 10*3/uL — ABNORMAL HIGH (ref 1.7–7.7)
Neutrophils Relative %: 72 %
Platelets: 189 10*3/uL (ref 150–400)
RBC: 5.26 MIL/uL (ref 4.22–5.81)
RDW: 13.1 % (ref 11.5–15.5)
WBC: 11.4 10*3/uL — ABNORMAL HIGH (ref 4.0–10.5)
nRBC: 0 % (ref 0.0–0.2)

## 2018-12-02 LAB — COMPREHENSIVE METABOLIC PANEL
ALK PHOS: 55 U/L (ref 38–126)
ALT: 16 U/L (ref 0–44)
AST: 19 U/L (ref 15–41)
Albumin: 4 g/dL (ref 3.5–5.0)
Anion gap: 6 (ref 5–15)
BUN: 12 mg/dL (ref 6–20)
CO2: 24 mmol/L (ref 22–32)
CREATININE: 0.92 mg/dL (ref 0.61–1.24)
Calcium: 9 mg/dL (ref 8.9–10.3)
Chloride: 106 mmol/L (ref 98–111)
GFR calc non Af Amer: >60 mL/min (ref >60)
GLUCOSE: 102 mg/dL — AB (ref 70–99)
Potassium: 3.8 mmol/L (ref 3.5–5.1)
SODIUM: 136 mmol/L (ref 135–145)
Total Bilirubin: 0.4 mg/dL (ref 0.3–1.2)
Total Protein: 7.1 g/dL (ref 6.5–8.1)

## 2018-12-02 LAB — LIPASE, BLOOD: LIPASE: 23 U/L (ref 11–51)

## 2018-12-02 LAB — TROPONIN I: Troponin I: <0.03 ng/mL (ref <0.03)

## 2018-12-02 MED ORDER — SODIUM CHLORIDE 0.9 % IV BOLUS
1000.0000 mL | Freq: Once | INTRAVENOUS | Status: AC
  Administered 2018-12-02: 1000 mL via INTRAVENOUS

## 2018-12-02 NOTE — ED Triage Notes (Signed)
Pt did not feel well all day yesterday, lightheaded, after getting home pt passed out around 7PM.  EMS called to the home, BS was normal, pt refused to be transport thought he would get to feeling better  Today Pt feels weak, light headed, chest tightness.

## 2018-12-02 NOTE — ED Notes (Signed)
Patient transported to X-ray 
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
## 2018-12-02 NOTE — Discharge Instructions (Addendum)
You were seen in the emergency department for feeling lightheaded and having a fainting episode at home yesterday.  You were also having some chest discomfort.  We did blood work EKG chest x-ray and CAT scan of your head.  An obvious cause of your symptoms was not found.  It will be important for you to follow-up with your doctor and please return if any worsening symptoms.  Please stay well-hydrated.

## 2019-02-11 ENCOUNTER — Emergency Department (HOSPITAL_COMMUNITY): Payer: Self-pay

## 2019-02-11 ENCOUNTER — Encounter (HOSPITAL_COMMUNITY): Payer: Self-pay | Admitting: Emergency Medicine

## 2019-02-11 ENCOUNTER — Other Ambulatory Visit: Payer: Self-pay

## 2019-02-11 ENCOUNTER — Emergency Department (HOSPITAL_COMMUNITY)
Admission: EM | Admit: 2019-02-11 | Discharge: 2019-02-11 | Disposition: A | Discharge status: Home / Self Care | Payer: Self-pay | Attending: Emergency Medicine | Admitting: Emergency Medicine

## 2019-02-11 DIAGNOSIS — Z79899 Other long term (current) drug therapy: Secondary | ICD-10-CM | POA: Insufficient documentation

## 2019-02-11 DIAGNOSIS — Y929 Unspecified place or not applicable: Secondary | ICD-10-CM | POA: Insufficient documentation

## 2019-02-11 DIAGNOSIS — Y939 Activity, unspecified: Secondary | ICD-10-CM | POA: Insufficient documentation

## 2019-02-11 DIAGNOSIS — S61209A Unspecified open wound of unspecified finger without damage to nail, initial encounter: Secondary | ICD-10-CM

## 2019-02-11 DIAGNOSIS — S66320A Laceration of extensor muscle, fascia and tendon of right index finger at wrist and hand level, initial encounter: Secondary | ICD-10-CM | POA: Insufficient documentation

## 2019-02-11 DIAGNOSIS — W458XXA Other foreign body or object entering through skin, initial encounter: Secondary | ICD-10-CM | POA: Insufficient documentation

## 2019-02-11 DIAGNOSIS — Y990 Civilian activity done for income or pay: Secondary | ICD-10-CM | POA: Insufficient documentation

## 2019-02-11 DIAGNOSIS — S61210A Laceration without foreign body of right index finger without damage to nail, initial encounter: Secondary | ICD-10-CM | POA: Insufficient documentation

## 2019-02-11 DIAGNOSIS — S56429A Laceration of extensor muscle, fascia and tendon of unspecified finger at forearm level, initial encounter: Secondary | ICD-10-CM

## 2019-02-11 DIAGNOSIS — F17210 Nicotine dependence, cigarettes, uncomplicated: Secondary | ICD-10-CM | POA: Insufficient documentation

## 2019-02-11 MED ORDER — DOXYCYCLINE HYCLATE 100 MG PO CAPS: 100.0000 mg | ORAL_CAPSULE | Freq: Two times a day (BID) | ORAL | 0 refills | Status: DC

## 2019-02-11 MED ORDER — IBUPROFEN 800 MG PO TABS: 800.0000 mg | ORAL_TABLET | Freq: Once | ORAL | Status: DC

## 2019-02-11 MED ORDER — POVIDONE-IODINE 10 % EX SOLN
CUTANEOUS | Status: AC
  Filled 2019-02-11: qty 15

## 2019-02-11 MED ORDER — CLINDAMYCIN PHOSPHATE 600 MG/50ML IV SOLN
600.0000 mg | Freq: Once | INTRAVENOUS | Status: AC
  Administered 2019-02-11: 23:00:00 600 mg via INTRAVENOUS
  Filled 2019-02-11: qty 50

## 2019-02-11 MED ORDER — TRAMADOL HCL 50 MG PO TABS: 50.0000 mg | ORAL_TABLET | Freq: Four times a day (QID) | ORAL | 0 refills | Status: DC | PRN

## 2019-02-11 MED ORDER — LIDOCAINE HCL (PF) 2 % IJ SOLN
INTRAMUSCULAR | Status: AC
  Filled 2019-02-11: qty 10
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
## 2019-02-11 NOTE — ED Triage Notes (Signed)
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 Bleeding controlled.

## 2019-02-11 NOTE — Discharge Instructions (Addendum)
Keep your wound clean, dry and covered.  Take the antibiotic as prescribed.  Plan to see Dr Amanda Pea in his office on Monday at noon for a recheck of your tendon injury.

## 2019-03-22 ENCOUNTER — Emergency Department (HOSPITAL_COMMUNITY)
Admission: EM | Admit: 2019-03-22 | Discharge: 2019-03-22 | Disposition: A | Discharge status: Home / Self Care | Payer: Self-pay | Attending: Emergency Medicine | Admitting: Emergency Medicine

## 2019-03-22 ENCOUNTER — Other Ambulatory Visit: Payer: Self-pay

## 2019-03-22 ENCOUNTER — Encounter (HOSPITAL_COMMUNITY): Payer: Self-pay | Admitting: *Deleted

## 2019-03-22 DIAGNOSIS — F17210 Nicotine dependence, cigarettes, uncomplicated: Secondary | ICD-10-CM | POA: Insufficient documentation

## 2019-03-22 DIAGNOSIS — L03116 Cellulitis of left lower limb: Secondary | ICD-10-CM | POA: Insufficient documentation

## 2019-03-22 DIAGNOSIS — Z79899 Other long term (current) drug therapy: Secondary | ICD-10-CM | POA: Insufficient documentation

## 2019-03-22 HISTORY — DX: Cyclical vomiting syndrome unrelated to migraine: R11.15

## 2019-03-22 LAB — CBC WITH DIFFERENTIAL/PLATELET
Abs Immature Granulocytes: 0.06 10*3/uL (ref 0.00–0.07)
Basophils Absolute: 0 10*3/uL (ref 0.0–0.1)
Basophils Relative: 0 %
Eosinophils Absolute: 0.2 10*3/uL (ref 0.0–0.5)
Eosinophils Relative: 2 %
HCT: 43.5 % (ref 39.0–52.0)
Hemoglobin: 13.9 g/dL (ref 13.0–17.0)
Immature Granulocytes: 1 %
Lymphocytes Relative: 18 %
Lymphs Abs: 1.9 10*3/uL (ref 0.7–4.0)
MCH: 29.5 pg (ref 26.0–34.0)
MCHC: 32 g/dL (ref 30.0–36.0)
MCV: 92.4 fL (ref 80.0–100.0)
Monocytes Absolute: 1 10*3/uL (ref 0.1–1.0)
Monocytes Relative: 9 %
Neutro Abs: 7.6 10*3/uL (ref 1.7–7.7)
Neutrophils Relative %: 70 %
Platelets: 193 10*3/uL (ref 150–400)
RBC: 4.71 MIL/uL (ref 4.22–5.81)
RDW: 12.4 % (ref 11.5–15.5)
WBC: 10.8 10*3/uL — ABNORMAL HIGH (ref 4.0–10.5)
nRBC: 0 % (ref 0.0–0.2)

## 2019-03-22 LAB — COMPREHENSIVE METABOLIC PANEL
ALT: 12 U/L (ref 0–44)
AST: 17 U/L (ref 15–41)
Albumin: 3.7 g/dL (ref 3.5–5.0)
Alkaline Phosphatase: 62 U/L (ref 38–126)
Anion gap: 10 (ref 5–15)
BUN: 6 mg/dL (ref 6–20)
CO2: 24 mmol/L (ref 22–32)
Calcium: 9 mg/dL (ref 8.9–10.3)
Chloride: 104 mmol/L (ref 98–111)
Creatinine, Ser: 0.84 mg/dL (ref 0.61–1.24)
GFR calc Af Amer: >60 mL/min (ref >60)
GFR calc non Af Amer: >60 mL/min (ref >60)
Glucose, Bld: 114 mg/dL — ABNORMAL HIGH (ref 70–99)
Potassium: 3.7 mmol/L (ref 3.5–5.1)
Sodium: 138 mmol/L (ref 135–145)
Total Bilirubin: 0.6 mg/dL (ref 0.3–1.2)
Total Protein: 6.9 g/dL (ref 6.5–8.1)

## 2019-03-22 MED ORDER — HYDROMORPHONE HCL 1 MG/ML IJ SOLN
1.0000 mg | Freq: Once | INTRAMUSCULAR | Status: AC
  Administered 2019-03-22: 1 mg via INTRAVENOUS
  Filled 2019-03-22: qty 1

## 2019-03-22 MED ORDER — ONDANSETRON HCL 4 MG/2ML IJ SOLN
4.0000 mg | Freq: Once | INTRAMUSCULAR | Status: AC
  Administered 2019-03-22: 4 mg via INTRAVENOUS
  Filled 2019-03-22: qty 2

## 2019-03-22 MED ORDER — ONDANSETRON HCL 4 MG PO TABS: 4.0000 mg | ORAL_TABLET | Freq: Three times a day (TID) | ORAL | 0 refills | Status: DC | PRN

## 2019-03-22 MED ORDER — CLINDAMYCIN HCL 150 MG PO CAPS: 300.0000 mg | ORAL_CAPSULE | Freq: Three times a day (TID) | ORAL | 0 refills | Status: DC

## 2019-03-22 MED ORDER — CLINDAMYCIN PHOSPHATE 600 MG/50ML IV SOLN
600.0000 mg | Freq: Once | INTRAVENOUS | Status: AC
  Administered 2019-03-22: 600 mg via INTRAVENOUS
  Filled 2019-03-22: qty 50

## 2019-03-22 NOTE — Discharge Instructions (Addendum)
As discussed, avoid squeezing your leg.  Warm wet soaks or compresses 2-3 times a day and take the anti-biotic as directed until its finished.  Return here on Thursday for recheck if not improving
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
## 2019-03-22 NOTE — ED Triage Notes (Signed)
Pt reports he had a blister on his left upper leg that opened up a few days ago. Pt reports the surrounding skin has now become swollen, red and tender that extends from mid thigh to calf.

## 2020-03-24 ENCOUNTER — Other Ambulatory Visit: Payer: Self-pay

## 2020-03-24 ENCOUNTER — Encounter (HOSPITAL_COMMUNITY): Payer: Self-pay | Admitting: *Deleted

## 2020-03-24 ENCOUNTER — Emergency Department (HOSPITAL_COMMUNITY)
Admission: EM | Admit: 2020-03-24 | Discharge: 2020-03-24 | Disposition: A | Discharge status: Home / Self Care | Payer: Self-pay | Attending: Emergency Medicine | Admitting: Emergency Medicine

## 2020-03-24 DIAGNOSIS — R112 Nausea with vomiting, unspecified: Secondary | ICD-10-CM | POA: Insufficient documentation

## 2020-03-24 DIAGNOSIS — Z5321 Procedure and treatment not carried out due to patient leaving prior to being seen by health care provider: Secondary | ICD-10-CM | POA: Insufficient documentation

## 2020-03-24 DIAGNOSIS — R109 Unspecified abdominal pain: Secondary | ICD-10-CM | POA: Insufficient documentation

## 2020-03-24 NOTE — ED Notes (Signed)
In to room with IV supplies   Pt reports he is here for the same thig he has been here before and wants to leave   Asked several times and pt reports that he needs an upper and lower scope and that he knows he needs to see GI and wants to leave AMA

## 2020-03-24 NOTE — ED Provider Notes (Signed)
  Patient eloped from the room before I was able to make contact with him.      Anselm Pancoast, PA-C 03/24/20 Harrietta Guardian    Geoffery Lyons, MD 03/24/20 2032

## 2020-03-24 NOTE — ED Triage Notes (Signed)
Pt with abd pain for a week with N/V, diarrhea some but denies at present.

## 2020-07-19 ENCOUNTER — Encounter (HOSPITAL_COMMUNITY): Payer: Self-pay | Admitting: *Deleted

## 2020-07-19 ENCOUNTER — Other Ambulatory Visit: Payer: Self-pay

## 2020-07-19 ENCOUNTER — Emergency Department (HOSPITAL_COMMUNITY)
Admission: EM | Admit: 2020-07-19 | Discharge: 2020-07-19 | Disposition: A | Discharge status: Home / Self Care | Attending: Emergency Medicine | Admitting: Emergency Medicine

## 2020-07-19 DIAGNOSIS — R509 Fever, unspecified: Secondary | ICD-10-CM | POA: Diagnosis not present

## 2020-07-19 DIAGNOSIS — L0231 Cutaneous abscess of buttock: Secondary | ICD-10-CM | POA: Diagnosis not present

## 2020-07-19 DIAGNOSIS — F17210 Nicotine dependence, cigarettes, uncomplicated: Secondary | ICD-10-CM | POA: Insufficient documentation

## 2020-07-19 DIAGNOSIS — F1590 Other stimulant use, unspecified, uncomplicated: Secondary | ICD-10-CM | POA: Diagnosis not present

## 2020-07-19 LAB — CBC WITH DIFFERENTIAL/PLATELET
Abs Immature Granulocytes: 0.31 10*3/uL — ABNORMAL HIGH (ref 0.00–0.07)
Basophils Absolute: 0 10*3/uL (ref 0.0–0.1)
Basophils Relative: 0 %
Eosinophils Absolute: 0 10*3/uL (ref 0.0–0.5)
Eosinophils Relative: 0 %
HCT: 37.1 % — ABNORMAL LOW (ref 39.0–52.0)
Hemoglobin: 11.7 g/dL — ABNORMAL LOW (ref 13.0–17.0)
Immature Granulocytes: 2 %
Lymphocytes Relative: 6 %
Lymphs Abs: 1 10*3/uL (ref 0.7–4.0)
MCH: 28.7 pg (ref 26.0–34.0)
MCHC: 31.5 g/dL (ref 30.0–36.0)
MCV: 90.9 fL (ref 80.0–100.0)
Monocytes Absolute: 1.4 10*3/uL — ABNORMAL HIGH (ref 0.1–1.0)
Monocytes Relative: 8 %
Neutro Abs: 14.1 10*3/uL — ABNORMAL HIGH (ref 1.7–7.7)
Neutrophils Relative %: 84 %
Platelets: 308 10*3/uL (ref 150–400)
RBC: 4.08 MIL/uL — ABNORMAL LOW (ref 4.22–5.81)
RDW: 13.4 % (ref 11.5–15.5)
WBC: 16.9 10*3/uL — ABNORMAL HIGH (ref 4.0–10.5)
nRBC: 0 % (ref 0.0–0.2)

## 2020-07-19 LAB — BASIC METABOLIC PANEL
Anion gap: 10 (ref 5–15)
BUN: 11 mg/dL (ref 6–20)
CO2: 24 mmol/L (ref 22–32)
Calcium: 8.6 mg/dL — ABNORMAL LOW (ref 8.9–10.3)
Chloride: 100 mmol/L (ref 98–111)
Creatinine, Ser: 0.78 mg/dL (ref 0.61–1.24)
GFR calc Af Amer: >60 mL/min (ref >60)
GFR calc non Af Amer: >60 mL/min (ref >60)
Glucose, Bld: 89 mg/dL (ref 70–99)
Potassium: 3.5 mmol/L (ref 3.5–5.1)
Sodium: 134 mmol/L — ABNORMAL LOW (ref 135–145)

## 2020-07-19 MED ORDER — IBUPROFEN 400 MG PO TABS
400.0000 mg | ORAL_TABLET | Freq: Once | ORAL | Status: AC
  Administered 2020-07-19: 400 mg via ORAL
  Filled 2020-07-19: qty 1

## 2020-07-19 MED ORDER — CLINDAMYCIN PHOSPHATE 600 MG/50ML IV SOLN
600.0000 mg | Freq: Once | INTRAVENOUS | Status: AC
  Administered 2020-07-19: 600 mg via INTRAVENOUS
  Filled 2020-07-19: qty 50

## 2020-07-19 NOTE — ED Provider Notes (Signed)
Pt presents with complaints of draining abscess on buttock Physical Exam  BP 102/67   Pulse 80   Temp 98.3 F (36.8 C) (Oral)   Resp 20   Ht 1.803 m (5\' 11" )   Wt 77.1 kg   SpO2 100%   BMI 23.71 kg/m   Physical Exam Area of erythema, induration, skin ulceration, left buttock ED Course/Procedures     Ultrasound ED Soft Tissue  Date/Time: 07/19/2020 1:20 PM Performed by: 07/21/2020, MD Authorized by: Linwood Dibbles, MD   Procedure details:    Indications: localization of abscess     Transverse view:  Visualized   Longitudinal view:  Visualized   Images: archived   Location:    Location: buttocks     Side:  Left Findings:     no abscess present    cellulitis present    no foreign body present    MDM  Medical screening examination/treatment/procedure(s) were conducted as a shared visit with non-physician practitioner(s) and myself.  I personally evaluated the patient during the encounter.  Exam consistent with cellulitis and spontaneously draining abscess.  No abscess noted on ultrasound.  Appears well, non toxic       Linwood Dibbles, MD 07/19/20 1321
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
## 2020-07-19 NOTE — ED Triage Notes (Signed)
Abscess to buttocks for 2 weeks, area is draining

## 2020-07-19 NOTE — Discharge Instructions (Addendum)
Your wound has been evaluated.  Lab work and imaging look reassuring.  I recommend continuing your antibiotics Bactrim and Keflex as prescribed.  I also would recommend that you soak or apply warm compresses to the wound and change the dressings twice a day.  We want to encourage drainage and the warm compress will help promote this.  For pain I recommend taking ibuprofen and/or Tylenol every 6 hours as needed for pain please follow dosing on the back of bottle.  I want you to have close follow-up please see your PCP in the next 3 days for a wound check.  I want you to come back to the emergency department if you develop fever, chills, worsening redness, swelling, pain around the area, chest pain, shortness of breath, abdominal pain, uncontrolled nausea, vomiting, diarrhea as theses symptoms require further evaluation management.

## 2020-07-22 LAB — AEROBIC CULTURE W GRAM STAIN (SUPERFICIAL SPECIMEN)

## 2020-07-23 ENCOUNTER — Telehealth: Payer: Self-pay | Admitting: *Deleted

## 2020-07-23 NOTE — Telephone Encounter (Signed)
Post ED Visit - Positive Culture Follow-up  Culture report reviewed by antimicrobial stewardship pharmacist: [facility] []  , Pharm.D. []  Enzo Bi, Pharm.D., BCPS AQ-ID []  , Pharm.D., BCPS []  Celedonio Miyamoto, Pharm.D., BCPS []  [place], Garvin Fila.D., BCPS, AAHIVP []  , Pharm.D., BCPS, AAHIVP [x]  Georgina Pillion, PharmD, BCPS []  , PharmD, BCPS []  [place], PharmD, BCPS []  [address], PharmD []  , PharmD, BCPS []  Estella Husk, PharmD  Pharmacy Team []  Lysle Pearl, PharmD []  , PharmD []  Phillips Climes, PharmD []  , Rph []  Agapito Games) , PharmD []  Verlan Friends, PharmD []  , PharmD []  Mervyn Gay, PharmD []  , PharmD []  Vinnie Level, PharmD []  [facility], PharmD []  , PharmD []  Len Childs, PharmD   Positive urine culture Per patient he is already on Keflex and Bactrim and no further patient follow-up is required at this time.  [facility] 07/23/2020, 10:36 AM

## 2024-03-23 ENCOUNTER — Other Ambulatory Visit: Payer: Self-pay

## 2024-03-23 ENCOUNTER — Encounter (HOSPITAL_COMMUNITY): Payer: Self-pay

## 2024-03-23 ENCOUNTER — Emergency Department (HOSPITAL_COMMUNITY): Payer: MEDICAID

## 2024-03-23 ENCOUNTER — Emergency Department (HOSPITAL_COMMUNITY)
Admission: EM | Admit: 2024-03-23 | Discharge: 2024-03-23 | Disposition: A | Discharge status: Home / Self Care | Payer: MEDICAID | Attending: Emergency Medicine | Admitting: Emergency Medicine

## 2024-03-23 DIAGNOSIS — S8992XA Unspecified injury of left lower leg, initial encounter: Secondary | ICD-10-CM | POA: Diagnosis present

## 2024-03-23 DIAGNOSIS — W010XXA Fall on same level from slipping, tripping and stumbling without subsequent striking against object, initial encounter: Secondary | ICD-10-CM | POA: Diagnosis not present

## 2024-03-23 DIAGNOSIS — Y9389 Activity, other specified: Secondary | ICD-10-CM | POA: Insufficient documentation

## 2024-03-23 DIAGNOSIS — M7989 Other specified soft tissue disorders: Secondary | ICD-10-CM | POA: Insufficient documentation

## 2024-03-23 DIAGNOSIS — Y92007 Garden or yard of unspecified non-institutional (private) residence as the place of occurrence of the external cause: Secondary | ICD-10-CM | POA: Insufficient documentation

## 2024-03-23 DIAGNOSIS — S00211A Abrasion of right eyelid and periocular area, initial encounter: Secondary | ICD-10-CM | POA: Insufficient documentation

## 2024-03-23 DIAGNOSIS — S86812A Strain of other muscle(s) and tendon(s) at lower leg level, left leg, initial encounter: Secondary | ICD-10-CM | POA: Insufficient documentation

## 2024-03-23 MED ORDER — IBUPROFEN 600 MG PO TABS: 600.0000 mg | ORAL_TABLET | Freq: Three times a day (TID) | ORAL | 0 refills | Status: DC

## 2024-03-23 MED ORDER — OXYCODONE-ACETAMINOPHEN 5-325 MG PO TABS: 1.0000 | ORAL_TABLET | Freq: Four times a day (QID) | ORAL | 0 refills | Status: AC | PRN

## 2024-03-23 MED ORDER — OXYCODONE-ACETAMINOPHEN 5-325 MG PO TABS
1.0000 | ORAL_TABLET | Freq: Once | ORAL | Status: AC
  Administered 2024-03-23: 1 via ORAL
  Filled 2024-03-23: qty 1

## 2024-03-23 NOTE — ED Triage Notes (Signed)
 Pt arrived via POV c/o injury to left knee, following a fall in his yard. Pt reports he tripped over some rocks 2 days ago, and report [place] radiates to his foot. Pt reports it feels like he may have "popped" his knee out. Pt ambulatory in Triage.

## 2024-03-23 NOTE — Discharge Instructions (Addendum)
 Avoid bending your knee by using the knee immobilizer at all times.  I recommend minimizing weight bearing as discussed.  You may take the oxycodone  prescribed for pain relief.  This will make you drowsy - do not drive within 4 hours of taking this medication. Ice and elevation will help with pain and swelling.   Call Dr. Phyllis Breeze in the morning for an office appointment.

## 2024-03-23 NOTE — ED Provider Notes (Signed)
 [place] EMERGENCY DEPARTMENT AT [facility] Provider Note   CSN: [id number] Arrival date & time: 03/23/24  1426     History  Chief Complaint  Patient presents with   Knee Pain    Shane [name] is a 48 y.o. male seen for evaluation of persistent left knee pain and swelling and difficulty with bending the knee since he tripped and fell 2 days ago.  He was working in a backyard when he tripped over some rocks and landed directly with all of his weight on his left flexed knee.  He heard an initial popping sensation and since that time has had pain and swelling.  He has also noticed inability to extend his lower leg since his injury.  He has used ice and elevation without improvement.  His pain radiates to his mid tibia area, he denies numbness or weakness of the ankle or foot distal to the injury site.  He scraped his right eyebrow on the grass when he fell, he denies LOC, dizziness, headache or vision complaints also no neck pain since this fall.   Knee Pain Associated symptoms: no back pain, no fever and no neck pain        Home Medications Prior to Admission medications   Medication Sig Start Date End Date Taking? Authorizing Provider  ibuprofen  (ADVIL ) 600 MG tablet Take 1 tablet (600 mg total) by mouth 3 (three) times daily. 03/23/24  Yes Sherrin Stahle, PA-C  oxyCODONE -acetaminophen  (PERCOCET/ROXICET) 5-325 MG tablet Take 1 tablet by mouth every 6 (six) hours as needed for up to 5 days for severe pain (pain score 7-10). 03/23/24 03/28/24 Yes Keaira Whitehurst, PA-C  amphetamine-dextroamphetamine (ADDERALL) 20 MG tablet Take 20 mg by mouth every morning.  04/01/18   [provider]  clindamycin  (CLEOCIN ) 150 MG capsule Take 2 capsules (300 mg total) by mouth 3 (three) times daily. 03/22/19   Triplett, Tammy, PA-C  doxycycline  (VIBRAMYCIN ) 100 MG capsule Take 1 capsule (100 mg total) by mouth 2 (two) times daily. Patient not taking: Reported on 03/22/2019 02/11/19   Karandeep Resende,  PA-C  omeprazole (PRILOSEC) 40 MG capsule Take 40 mg by mouth daily.    [provider]  ondansetron  (ZOFRAN  ODT) 4 MG disintegrating tablet Take 1 tablet (4 mg total) by mouth every 8 (eight) hours as needed for nausea. 07/04/17   Early Glisson, MD  ondansetron  (ZOFRAN ) 4 MG tablet Take 1 tablet (4 mg total) by mouth every 8 (eight) hours as needed for nausea or vomiting. 03/22/19   Triplett, Tammy, PA-C  promethazine  (PHENERGAN ) 25 MG suppository Place 1 suppository (25 mg total) rectally every 6 (six) hours as needed for nausea or vomiting. 07/04/17   Early Glisson, MD  ranitidine  (ZANTAC ) 150 MG tablet Take 1 tablet (150 mg total) by mouth 2 (two) times daily. 05/29/17   Mesner, Reymundo Caulk, MD      Allergies    Penicillins and Vancomycin    Review of Systems   Review of Systems  Constitutional:  Negative for fever.  Eyes:  Negative for visual disturbance.  Musculoskeletal:  Positive for arthralgias and joint swelling. Negative for back pain, myalgias and neck pain.  Skin:  Positive for wound.  Neurological:  Negative for weakness, numbness and headaches.    Physical Exam Updated Vital Signs BP (!) 176/111 (BP Location: Right Arm)   Pulse 73   Temp 98.1 F (36.7 C) (Oral)   Resp (!) 21   Ht 5\' 11"  (1.803 m)  Wt 77.1 kg   SpO2 100%   BMI 23.71 kg/m  Physical Exam Constitutional:      Appearance: He is well-developed.  HENT:     Head: Atraumatic.     Comments: Superficial abrasion across right brow.  Cardiovascular:     Comments: Pulses equal bilaterally Musculoskeletal:        General: Swelling and tenderness present.     Cervical back: Normal range of motion.     Left knee: Swelling present. Decreased range of motion. Tenderness present over the patellar tendon. Normal pulse.     Comments: Ttp superior to tibial tuberosity with palpable tendon disruption.  Pt cannot SLR left without knee joint collapse.  Skin:    General: Skin is warm and dry.  Neurological:      Mental Status: He is alert.     Sensory: No sensory deficit.     Motor: No weakness.     Deep Tendon Reflexes: Reflexes normal.     ED Results / Procedures / Treatments   Labs (all labs ordered are listed, but only abnormal results are displayed) Labs Reviewed - No data to display  EKG None  Radiology DG Knee Complete 4 Views Left Result Date: 03/23/2024 CLINICAL DATA:  Status post fall, injury.  Pain. EXAM: LEFT KNEE - COMPLETE 4+ VIEW COMPARISON:  None Available. FINDINGS: Chronic ossific density adjacent to the anterior tibial tubercle, however adjacent to this chronic densities a potential acute avulsion type fracture in the region of the patellar tendon insertion. There is overlying soft tissue thickening and edema within Hoffa's fat pad. No other fracture. Normal alignment and joint spaces. There may be a small joint effusion. Minor patellofemoral spurring. Mild generalized soft tissue edema. IMPRESSION: 1. Chronic ossific density adjacent to the anterior tibial tubercle, however adjacent to this chronic density is a potential acute avulsion type fracture in the region of the patellar tendon insertion. There is overlying soft tissue thickening and edema within Hoffa's fat pad. 2. Possible small joint effusion. Electronically Signed   By: Chadwick Colonel M.D.   On: 03/23/2024 17:19    Procedures Procedures    Medications Ordered in ED Medications  oxyCODONE -acetaminophen  (PERCOCET/ROXICET) 5-325 MG per tablet 1 tablet (1 tablet Oral Given 03/23/24 1729)    ED Course/ Medical Decision Making/ A&P                                 Medical Decision Making Pt with direct blow to left knee from fall with exam and imaging findings suggesting patellar tendon partial vs full tear.  He cannot SLR, difficult to determine how much is pain vs true weakness.  He is placed in a knee immobilizer.  We discussed crutches or Darius - pt refuses both.  States he has figured out how to stand without  turning the foot to avoid pain. He will f/u with Dr Phyllis Breeze - to call in am.  Ice,  rest, elevation.  Prescribed oxycodone .  Amount and/or Complexity of Data Reviewed Radiology: ordered.    Details: Reviewed,  agree with interpretation.  He does have a history of prior injury to the knee, so the ossified density that appears chronic does fit his reported hx.   Risk Prescription drug management.           Final Clinical Impression(s) / ED Diagnoses Final diagnoses:  Patellar tendon rupture, left, initial encounter    Rx / DC Orders  ED Discharge Orders          Ordered    oxyCODONE -acetaminophen  (PERCOCET/ROXICET) 5-325 MG tablet  Every 6 hours PRN        03/23/24 1806    ibuprofen  (ADVIL ) 600 MG tablet  3 times daily        03/23/24 1806              Pyper Olexa, PA-C 03/23/24 1841    Zammit, Joseph, MD 03/25/24 1322

## 2024-03-30 ENCOUNTER — Encounter: Payer: Self-pay | Admitting: Orthopedic Surgery

## 2024-03-30 ENCOUNTER — Ambulatory Visit: Payer: MEDICAID | Admitting: Orthopedic Surgery

## 2024-03-30 ENCOUNTER — Telehealth: Payer: Self-pay | Admitting: Orthopedic Surgery

## 2024-03-30 ENCOUNTER — Ambulatory Visit (HOSPITAL_COMMUNITY): Admission: RE | Admit: 2024-03-30 | Payer: MEDICAID | Source: Ambulatory Visit

## 2024-03-30 VITALS — BP 184/108 | HR 102 | Ht 71.0 in | Wt 169.0 lb

## 2024-03-30 DIAGNOSIS — M25562 Pain in left knee: Secondary | ICD-10-CM

## 2024-03-30 DIAGNOSIS — S86812A Strain of other muscle(s) and tendon(s) at lower leg level, left leg, initial encounter: Secondary | ICD-10-CM | POA: Diagnosis not present

## 2024-03-30 MED ORDER — IBUPROFEN 800 MG PO TABS: 800.0000 mg | ORAL_TABLET | Freq: Three times a day (TID) | ORAL | 1 refills | Status: DC | PRN

## 2024-03-30 MED ORDER — OXYCODONE-ACETAMINOPHEN 10-325 MG PO TABS: 1.0000 | ORAL_TABLET | ORAL | 0 refills | Status: DC | PRN

## 2024-03-30 NOTE — Progress Notes (Signed)
  Intake history:  BP (!) 184/108   Pulse (!) 102   Ht 5\' 11"  (1.803 m)   Wt 169 lb (76.7 kg)   BMI 23.57 kg/m  Body mass index is 23.57 kg/m.    WHAT ARE WE SEEING YOU FOR TODAY?   left knee(s)  How long has this bothered you? (DOI?DOS?WS?)  on 03/22/24  Anticoag.  No  Diabetes No  Heart disease No  Hypertension No  SMOKING HX Yes  Kidney disease No  Any ALLERGIES ____________ Allergies  Allergen Reactions   Penicillins Hives and Itching    Has patient had a PCN reaction causing immediate rash, facial/tongue/throat swelling, SOB or lightheadedness with hypotension: Yes Has patient had a PCN reaction causing severe rash involving mucus membranes or skin necrosis: No Has patient had a PCN reaction that required hospitalization No Has patient had a PCN reaction occurring within the last 10 years: Yes If all of the above answers are "NO", then may proceed with Cephalosporin use.    Vancomycin Hives and Itching   __________________________________   Treatment:  Have you taken:  Tylenol  No  Advil  Yes  Had PT No  Had injection No  Other  _________________________

## 2024-03-30 NOTE — Telephone Encounter (Signed)
 Dr. Delfino Fellers pt - Shane [name] took a message from the preservice center from [place] [phone] ext [phone] stating the pt's MRI needs a PA.  Shane [name] wrote on the message she gave me that she gave this to Sabrina, but I'm sending a message so it's documented in the chart.

## 2024-03-30 NOTE — Patient Instructions (Signed)
 While we are working on your approval for MRI please go ahead and call to schedule your appointment with [facility] within at least one (1) week.   Central Scheduling [phone]
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
## 2024-03-31 NOTE — Telephone Encounter (Signed)
 This has been taken care of late yesterday, Preservice center is aware of approval.

## 2024-04-01 ENCOUNTER — Telehealth: Payer: Self-pay | Admitting: Radiology

## 2024-04-01 NOTE — Telephone Encounter (Signed)
 Macdonald Savoy called for patient, LMVM at [facility] saying that he has 2 Rx at [facility], and one is $4 and one is $81.  [facility] told patient we denied/PA denied.  Please call to discuss.  Lisa's number is [phone].

## 2024-04-02 ENCOUNTER — Ambulatory Visit (HOSPITAL_COMMUNITY)
Admission: RE | Admit: 2024-04-02 | Discharge: 2024-04-02 | Disposition: A | Discharge status: Home / Self Care | Payer: MEDICAID | Source: Ambulatory Visit | Attending: Orthopedic Surgery | Admitting: Orthopedic Surgery

## 2024-04-02 DIAGNOSIS — M25562 Pain in left knee: Secondary | ICD-10-CM | POA: Diagnosis present

## 2024-04-04 ENCOUNTER — Ambulatory Visit (HOSPITAL_COMMUNITY): Payer: MEDICAID

## 2024-04-04 NOTE — Telephone Encounter (Signed)
 Thanks, as I told her this morning she is not on his HIPAA so I can't talk to her. I will only be able to speak to him.

## 2024-04-04 NOTE — Telephone Encounter (Signed)
 Shane [name] [phone] lvm for [name], returning the call regarding the pt's meds.

## 2024-04-04 NOTE — Telephone Encounter (Addendum)
 His meds were sent in for him, he can call his  Medicaid about the cost of the meds, we do not set prices. He has to pay what his Medicaid will not cover. I left message for him to call me back

## 2024-04-05 ENCOUNTER — Other Ambulatory Visit: Payer: Self-pay | Admitting: *Deleted

## 2024-04-05 ENCOUNTER — Telehealth: Payer: Self-pay | Admitting: Orthopedic Surgery

## 2024-04-05 DIAGNOSIS — R609 Edema, unspecified: Secondary | ICD-10-CM

## 2024-04-05 NOTE — Telephone Encounter (Signed)
 Shane [name] is aware of appt. Scheduled for tomorrow June 11 at 3pm at [facility]

## 2024-04-05 NOTE — Telephone Encounter (Signed)
 I spoke with Madelyne Schiff (MOM) while in office she has informed me that pt Left knee is swollen, painful to touch, warm to touch, has redness/purple at times and has indention from his socks.   Worried it may be a Blood clot. Says the swelling is 2x bigger then his normal size.   Do you want to order an Ultrasound to rule out DVT?  Please advise.   The only number to get hold of pt will be [employer] at [phone]. She isnt on his DPR but is only way to get pt. She says she was here at the last visit with him.

## 2024-04-05 NOTE — Telephone Encounter (Signed)
 DR. Phyllis Breeze    The medicine he prescribed has to be authorized it is $81.00 now and he can't pay it.Aaron Aas  He doesn't have a job and his mom can't pay it he doesn't have a phone and mom has a phone.  She called yesterday and left [name] a message and she never called back.  She is not on his DPR. He had a MRI done Saturday and the only number we have is hers.  Again I told her he is going to have to come up here and sign a DPR giving us  permission to talk with his mother.    Pain medicine needs a prior authorization that is her main concern so she can get the medicine for him

## 2024-04-05 NOTE — Telephone Encounter (Signed)
 Order has been placed- tried calling Madelyne Schiff back- left vm to return call

## 2024-04-06 ENCOUNTER — Telehealth: Payer: Self-pay

## 2024-04-06 ENCOUNTER — Telehealth: Payer: Self-pay | Admitting: Orthopedic Surgery

## 2024-04-06 ENCOUNTER — Ambulatory Visit (HOSPITAL_COMMUNITY)
Admission: RE | Admit: 2024-04-06 | Discharge: 2024-04-06 | Disposition: A | Discharge status: Home / Self Care | Payer: MEDICAID | Source: Ambulatory Visit | Attending: Vascular Surgery | Admitting: Vascular Surgery

## 2024-04-06 DIAGNOSIS — R609 Edema, unspecified: Secondary | ICD-10-CM

## 2024-04-06 NOTE — Telephone Encounter (Signed)
 Dr. Delfino Fellers pt - Shane [name] w/[facility] cell # [phone] called w/a call report for this pt, was sent for a stat lt lower extremity to rule out DVT, it is negative.  She has let the pt leave.

## 2024-04-06 NOTE — Telephone Encounter (Signed)
 Denied on June 5 by [employer] 2017 Document: Decision Notes: Policy rules found in the [place]  [employer] [facility]acy [facility] Clinical Coverage Policy for ?Opioid Analgesics? guided our decision. Here are the policy requirements your request did not meet: 1. Records show your doctor is following guidelines to evaluate you, come up with a treatment plan, educate you about risks and benefits of treatment, re-evaluate the treatment plan, and work with specialists if needed. Since the policy requirements have not been met, we are not able to approve this request. Please look at the formulary to see what drugs are covered. Pre-approval may be needed, and quantity limits may apply to covered drugs.

## 2024-04-06 NOTE — Telephone Encounter (Signed)
 Patient and his mom would like to speak with you in regards to his medication.

## 2024-04-06 NOTE — Telephone Encounter (Signed)
 I advised him Medicaid denied coverage of the meds. He should contact Medicaid

## 2024-04-07 ENCOUNTER — Telehealth: Payer: Self-pay | Admitting: Radiology

## 2024-04-07 ENCOUNTER — Telehealth: Payer: Self-pay | Admitting: Orthopedic Surgery

## 2024-04-07 NOTE — Telephone Encounter (Signed)
 Dr Phyllis Breeze asked me to call for a reading on MRI  I called for report

## 2024-04-07 NOTE — Telephone Encounter (Signed)
 Dr. Delfino Fellers pt - spoke w/the pt's mom Edwina Gram, she stated that the pt's Oxycodone  10-325 was denied, but the insurance company stated they would cover Oxycodone  5-325.  She would like this changed.  [facility] [place]

## 2024-04-07 NOTE — Telephone Encounter (Signed)
 Yes, I added him for tomorrow morning.

## 2024-04-08 ENCOUNTER — Encounter: Payer: Self-pay | Admitting: Orthopedic Surgery

## 2024-04-08 ENCOUNTER — Ambulatory Visit (INDEPENDENT_AMBULATORY_CARE_PROVIDER_SITE_OTHER): Payer: MEDICAID | Admitting: Orthopedic Surgery

## 2024-04-08 DIAGNOSIS — S86812A Strain of other muscle(s) and tendon(s) at lower leg level, left leg, initial encounter: Secondary | ICD-10-CM | POA: Diagnosis not present

## 2024-04-08 MED ORDER — OXYCODONE-ACETAMINOPHEN 5-325 MG PO TABS: 1.0000 | ORAL_TABLET | ORAL | 0 refills | Status: DC | PRN

## 2024-04-08 NOTE — Progress Notes (Signed)
   There were no vitals taken for this visit.  There is no height or weight on file to calculate BMI.  Chief Complaint  Patient presents with   Knee Pain    Left     No diagnosis found.  DOI/DOS/ Date: 03/22/24  Unchanged here for results of MRI scan Also had US   Patients Oxycodone  was denied by [facility]

## 2024-04-08 NOTE — Progress Notes (Signed)
  MRI follow-up left knee injury   Chief Complaint  Patient presents with   Knee Pain    Left     Encounter Diagnosis  Name Primary?   partial rupture of left patellar tendon, initial encounter Yes    DOI/DOS/ Date: 03/22/24  17 days post injury   Unchanged here for results of MRI scan Also had US   Patients Oxycodone  was denied by Medicaid   I reviewed the MRI with the patient and we did a extension check  He is in a simple hinged knee brace he would benefit more from a playmaker brace  He has a partial tear on the MRI  He has terminal knee extension test where he can extend the knee and straight leg raise he has a slight extensor lag  His pain is decreased significantly as the swelling  I think it is fine to just use Percocet for pain at this point along with ibuprofen   Meds ordered this encounter  Medications   oxyCODONE -acetaminophen  (PERCOCET) 5-325 MG tablet    Sig: Take 1 tablet by mouth every 4 (four) hours as needed for severe pain (pain score 7-10).    Dispense:  30 tablet    Refill:  0

## 2024-04-13 ENCOUNTER — Other Ambulatory Visit: Payer: Self-pay | Admitting: Orthopedic Surgery

## 2024-04-13 DIAGNOSIS — S86812A Strain of other muscle(s) and tendon(s) at lower leg level, left leg, initial encounter: Secondary | ICD-10-CM

## 2024-04-13 MED ORDER — OXYCODONE-ACETAMINOPHEN 5-325 MG PO TABS: 1.0000 | ORAL_TABLET | ORAL | 0 refills | Status: DC | PRN

## 2024-04-13 NOTE — Progress Notes (Signed)
 Meds ordered this encounter  Medications   oxyCODONE -acetaminophen  (PERCOCET) 5-325 MG tablet    Sig: Take 1 tablet by mouth every 4 (four) hours as needed for severe pain (pain score 7-10).    Dispense:  30 tablet    Refill:  0

## 2024-04-13 NOTE — Telephone Encounter (Signed)
 I heard from [facility] yesterday He can come here for the playmaker brace  Refill request to Dr Marvina Slough

## 2024-04-13 NOTE — Telephone Encounter (Signed)
 Dr. Delfino Fellers pt - pt's mom Shane [name] lvm for the pt requesting a refill for Oxycodone  5-325, 30 tablets, every 4 hours PRN for severe pain (pain score 7-10), to be sent to [facility] in [place].  She stated that he is completely out.  She also stated that his leg is hurting more than it was last week when he was seen and is having some swelling.  She also wants to know about the brace as they have not hear from anyone regarding that.  She would like a call back [phone].

## 2024-04-13 NOTE — Telephone Encounter (Signed)
 Dr. Delfino Fellers pt - pt's mom Madelyne Schiff lvm stating the message was for [name], he is going to try and get a ride up here today to be fitted for the brace.

## 2024-04-19 ENCOUNTER — Telehealth: Payer: Self-pay | Admitting: Orthopedic Surgery

## 2024-04-19 ENCOUNTER — Telehealth: Payer: Self-pay | Admitting: Radiology

## 2024-04-19 NOTE — Telephone Encounter (Signed)
 Completed another prior authorization for the  Oxycodone  for patient

## 2024-04-19 NOTE — Telephone Encounter (Signed)
 Yes as long as he doesn't come between 12 and 1 or after noon on Friday he can come any day this week I called her to advise   FYI He is coming by to be fitted for a brace maybe tomorrow

## 2024-04-19 NOTE — Telephone Encounter (Signed)
 Dr. Areatha pt - Shane [name] the pt's mom lvm for [name], wants to know if the pt can come tomorrow to get fitted for the brace [phone]

## 2024-04-20 ENCOUNTER — Telehealth: Payer: Self-pay | Admitting: Radiology

## 2024-04-20 DIAGNOSIS — S86812A Strain of other muscle(s) and tendon(s) at lower leg level, left leg, initial encounter: Secondary | ICD-10-CM

## 2024-04-20 NOTE — Telephone Encounter (Signed)
 Patient requests refill of Oxycodone  to [facility] Was fitted for his Playmaker brace today

## 2024-04-20 NOTE — Telephone Encounter (Signed)
 Rafferty Postlewait ([facility]) - [phone] oxyCODONE -Acetaminophen  10-325MG  tablets status: PA Response - DeniedCreated: June 23rd, 2025 [id number] Sent: June 24th, 2025 Open  Archive

## 2024-04-21 ENCOUNTER — Other Ambulatory Visit: Payer: Self-pay | Admitting: Orthopedic Surgery

## 2024-04-21 DIAGNOSIS — S86812A Strain of other muscle(s) and tendon(s) at lower leg level, left leg, initial encounter: Secondary | ICD-10-CM

## 2024-04-21 MED ORDER — OXYCODONE-ACETAMINOPHEN 5-325 MG PO TABS: 1.0000 | ORAL_TABLET | Freq: Four times a day (QID) | ORAL | 0 refills | Status: AC | PRN

## 2024-04-21 NOTE — Telephone Encounter (Signed)
 DR.  MARGRETTE [facility] wants a call back at [phone] about the patients Oxycodone 

## 2024-04-21 NOTE — Telephone Encounter (Signed)
 I called them he was  on Suboxone for years Wants to make sure Dr Margrette was aware, I discussed with Dr Margrette and no we were not aware, but are now. Ok to give the Oxycodone  as prescribed and that does explain why he needs a higher med than what would normally be used.

## 2024-05-02 ENCOUNTER — Telehealth: Payer: Self-pay | Admitting: Orthopedic Surgery

## 2024-05-02 NOTE — Telephone Encounter (Signed)
 Dr. Areatha pt - pt's mom Olam called, lvm stating the brace is irritating him, can you do something about it?  [phone]

## 2024-05-02 NOTE — Telephone Encounter (Signed)
 He can come in for me to look at it Not today I will be in xray after 2something Maybe Wed afternoon?

## 2024-05-02 NOTE — Telephone Encounter (Signed)
 Called and lvm for Shane [name] that she can bring the pt around 1:30 or 2pm Wednesday to have [name] check his brace.

## 2024-05-03 ENCOUNTER — Telehealth: Payer: Self-pay | Admitting: Orthopedic Surgery

## 2024-05-03 NOTE — Telephone Encounter (Signed)
 Dr. Areatha pt - pt's mom, Olam [phone] lvm last night requesting a refill for the pt for Oxycodone  5-325, 20 tablets, every 6 hours PRN for severe pain.  She also stated that the pt was going to try and come today to have [name] look at his brace.  I called her back and advised that [name] stated he would need to come tomorrow afternoon and reminded her that is what I left in the message for him yesterday, that there is not a clinical person here today, she verbalized understanding.

## 2024-05-04 ENCOUNTER — Other Ambulatory Visit: Payer: Self-pay | Admitting: Orthopedic Surgery

## 2024-05-04 MED ORDER — HYDROCODONE-ACETAMINOPHEN 10-325 MG PO TABS: 1.0000 | ORAL_TABLET | Freq: Four times a day (QID) | ORAL | 0 refills | Status: AC | PRN

## 2024-05-09 ENCOUNTER — Encounter: Payer: Self-pay | Admitting: Orthopedic Surgery

## 2024-05-09 ENCOUNTER — Telehealth: Payer: Self-pay

## 2024-05-09 NOTE — Telephone Encounter (Signed)
 Patient sent his mom here to see if he could get a note stating that he is still under your care. Is it ok to do the note?  Thanks

## 2024-07-12 ENCOUNTER — Emergency Department (HOSPITAL_COMMUNITY)
Admission: EM | Admit: 2024-07-12 | Discharge: 2024-07-12 | Disposition: A | Discharge status: Home / Self Care | Payer: MEDICAID | Attending: Emergency Medicine | Admitting: Emergency Medicine

## 2024-07-12 ENCOUNTER — Other Ambulatory Visit: Payer: Self-pay

## 2024-07-12 ENCOUNTER — Encounter (HOSPITAL_COMMUNITY): Payer: Self-pay | Admitting: Emergency Medicine

## 2024-07-12 DIAGNOSIS — R443 Hallucinations, unspecified: Secondary | ICD-10-CM | POA: Insufficient documentation

## 2024-07-12 DIAGNOSIS — F1193 Opioid use, unspecified with withdrawal: Secondary | ICD-10-CM

## 2024-07-12 LAB — CBC WITH DIFFERENTIAL/PLATELET
Abs Immature Granulocytes: 0.02 K/uL (ref 0.00–0.07)
Basophils Absolute: 0 K/uL (ref 0.0–0.1)
Basophils Relative: 0 %
Eosinophils Absolute: 0.2 K/uL (ref 0.0–0.5)
Eosinophils Relative: 2 %
HCT: 39.3 % (ref 39.0–52.0)
Hemoglobin: 12.7 g/dL — ABNORMAL LOW (ref 13.0–17.0)
Immature Granulocytes: 0 %
Lymphocytes Relative: 17 %
Lymphs Abs: 1.4 K/uL (ref 0.7–4.0)
MCH: 28 pg (ref 26.0–34.0)
MCHC: 32.3 g/dL (ref 30.0–36.0)
MCV: 86.6 fL (ref 80.0–100.0)
Monocytes Absolute: 0.8 K/uL (ref 0.1–1.0)
Monocytes Relative: 11 %
Neutro Abs: 5.5 K/uL (ref 1.7–7.7)
Neutrophils Relative %: 70 %
Platelets: 290 K/uL (ref 150–400)
RBC: 4.54 MIL/uL (ref 4.22–5.81)
RDW: 12.7 % (ref 11.5–15.5)
WBC: 7.9 K/uL (ref 4.0–10.5)
nRBC: 0 % (ref 0.0–0.2)

## 2024-07-12 LAB — RAPID URINE DRUG SCREEN, HOSP PERFORMED
Amphetamines: POSITIVE — AB
Barbiturates: NOT DETECTED
Benzodiazepines: NOT DETECTED
Cocaine: POSITIVE — AB
Opiates: NOT DETECTED
Tetrahydrocannabinol: NOT DETECTED

## 2024-07-12 LAB — COMPREHENSIVE METABOLIC PANEL WITH GFR
ALT: 14 U/L (ref 0–44)
AST: 17 U/L (ref 15–41)
Albumin: 3.6 g/dL (ref 3.5–5.0)
Alkaline Phosphatase: 71 U/L (ref 38–126)
Anion gap: 11 (ref 5–15)
BUN: 11 mg/dL (ref 6–20)
CO2: 22 mmol/L (ref 22–32)
Calcium: 9.4 mg/dL (ref 8.9–10.3)
Chloride: 103 mmol/L (ref 98–111)
Creatinine, Ser: 1.05 mg/dL (ref 0.61–1.24)
GFR, Estimated: >60 mL/min (ref >60)
Glucose, Bld: 78 mg/dL (ref 70–99)
Potassium: 3.5 mmol/L (ref 3.5–5.1)
Sodium: 136 mmol/L (ref 135–145)
Total Bilirubin: 0.6 mg/dL (ref 0.0–1.2)
Total Protein: 7.7 g/dL (ref 6.5–8.1)

## 2024-07-12 LAB — SALICYLATE LEVEL: Salicylate Lvl: <7 mg/dL — ABNORMAL LOW (ref 7.0–30.0)

## 2024-07-12 LAB — ACETAMINOPHEN LEVEL: Acetaminophen (Tylenol), Serum: <10 ug/mL — ABNORMAL LOW (ref 10–30)

## 2024-07-12 LAB — ETHANOL: Alcohol, Ethyl (B): <15 mg/dL (ref <15)

## 2024-07-12 MED ORDER — ZOLPIDEM TARTRATE 5 MG PO TABS: 5.0000 mg | ORAL_TABLET | Freq: Every evening | ORAL | Status: DC | PRN

## 2024-07-12 MED ORDER — ONDANSETRON HCL 4 MG PO TABS: 4.0000 mg | ORAL_TABLET | Freq: Three times a day (TID) | ORAL | Status: DC | PRN

## 2024-07-12 MED ORDER — ACETAMINOPHEN 325 MG PO TABS: 650.0000 mg | ORAL_TABLET | ORAL | Status: DC | PRN

## 2024-07-12 NOTE — ED Triage Notes (Signed)
 Pt states his mother took IVC paperwork out on him due to having hallucinations. Pt states he has been detoxing off fentanyl  for 6 days and seen [facility] (local outpatient detox center) yesterday for the same. Pt denies any SI/HI ideations.

## 2024-07-12 NOTE — ED Provider Notes (Signed)
 Patient is medically cleared for TTS evaluation.   Daralene Lonni BIRCH, PA-C 07/12/24 [id number]    Patsey Lot, MD 07/12/24 [phone]

## 2024-07-12 NOTE — Discharge Instructions (Signed)
 Please continue to follow-up with the substance abuse location as you have already seen.  Return to emergency department immediately for any new or worsening symptoms.
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
## 2024-07-12 NOTE — ED Provider Notes (Signed)
 [place] EMERGENCY DEPARTMENT AT [facility] Provider Note   CSN: [id number] Arrival date & time: 07/12/24  1445     Patient presents with: V70.1   Shane [name] is a 48 y.o. male.   Patient is a 48 year old male who presents to the emergency department under an IVC which was taken out by his mother.  Mother notes that the patient has been hallucinating over the past 6 days and does have concerns about his safety as well as the safety of others.  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  He denies any other alcohol or illicit substance abuse.  He denies any active SI or HI at this point.  He has no systemic complaints at this point to include fever, chills, chest pain, shortness of breath, Donnell pain, nausea, vomiting, diarrhea.  He admits to some generalized bodyaches from his withdrawal.  He does note that he was evaluated yesterday to be set up for Suboxone but has not started this as of yet.  Patient does admit to hallucinations.        Prior to Admission medications   Medication Sig Start Date End Date Taking? Authorizing Provider  ibuprofen  (ADVIL ) 800 MG tablet Take 1 tablet (800 mg total) by mouth every 8 (eight) hours as needed. 03/30/24   Margrette Taft BRAVO, MD  omeprazole (PRILOSEC) 40 MG capsule Take 40 mg by mouth daily.    [provider]    Allergies: Penicillins and Vancomycin    Review of Systems  Psychiatric/Behavioral:  Positive for hallucinations.   All other systems reviewed and are negative.   Updated Vital Signs BP (!) 129/100   Pulse 89   Temp 98.2 F (36.8 C) (Oral)   Resp 16   Ht 5' 11 (1.803 m)   Wt 76.7 kg   SpO2 95%   BMI 23.58 kg/m   Physical Exam Vitals and nursing note reviewed.  Constitutional:      General: He is not in acute distress.    Appearance: Normal appearance. He is not ill-appearing.  HENT:     Head: Normocephalic and atraumatic.     Nose: Nose  normal.     Mouth/Throat:     Mouth: Mucous membranes are moist.  Eyes:     Extraocular Movements: Extraocular movements intact.     Conjunctiva/sclera: Conjunctivae normal.     Pupils: Pupils are equal, round, and reactive to light.  Cardiovascular:     Rate and Rhythm: Normal rate and regular rhythm.     Pulses: Normal pulses.     Heart sounds: Normal heart sounds. No murmur heard.    No gallop.  Pulmonary:     Effort: Pulmonary effort is normal. No respiratory distress.     Breath sounds: Normal breath sounds. No stridor. No wheezing, rhonchi or rales.  Abdominal:     General: Abdomen is flat. Bowel sounds are normal. There is no distension.     Palpations: Abdomen is soft.     Tenderness: There is no abdominal tenderness. There is no guarding.  Musculoskeletal:        General: Normal range of motion.     Cervical back: Normal range of motion and neck supple.  Skin:    General: Skin is warm and dry.     Findings: No bruising or rash.  Neurological:     General: No focal deficit present.     Mental Status: He is alert  and oriented to person, place, and time. Mental status is at baseline.     Cranial Nerves: No cranial nerve deficit.     Sensory: No sensory deficit.     Motor: No weakness.     Coordination: Coordination normal.  Psychiatric:        Attention and Perception: He is attentive. He perceives visual hallucinations.        Mood and Affect: Mood is not anxious or elated. Affect is not blunt, flat, angry or tearful.        Speech: He is communicative. Speech is not rapid and pressured, delayed, slurred or tangential.        Behavior: Behavior is actively hallucinating. Behavior is not agitated, slowed, aggressive or withdrawn.        Thought Content: Thought content is not paranoid or delusional. Thought content does not include homicidal or suicidal ideation. Thought content does not include homicidal or suicidal plan.        Cognition and Memory: Cognition is not  impaired. Memory is not impaired. He does not exhibit impaired recent memory or impaired remote memory.        Judgment: Judgment is not impulsive or inappropriate.     (all labs ordered are listed, but only abnormal results are displayed) Labs Reviewed  RAPID URINE DRUG SCREEN, HOSP PERFORMED - Abnormal; Notable for the following components:      Result Value   Cocaine POSITIVE (*)    Amphetamines POSITIVE (*)    All other components within normal limits  CBC WITH DIFFERENTIAL/PLATELET - Abnormal; Notable for the following components:   Hemoglobin 12.7 (*)    All other components within normal limits  COMPREHENSIVE METABOLIC PANEL WITH GFR  ETHANOL  SALICYLATE LEVEL  ACETAMINOPHEN  LEVEL    EKG: None  Radiology: No results found.   Procedures   Medications Ordered in the ED - No data to display                                  Medical Decision Making Patient is doing well at this time and does remain stable.  He was evaluated by psychiatry who did deem that the patient does not meet criteria for involuntary admission at this point.  I do agree with this.  He adamantly denies any SI or HI.  He does not appear to be having any active hallucinations at this point.  Do suspect that this is secondary to his opiate withdrawal.  Vital signs are otherwise stable at this point.  Do not specked any further emergent workup is warranted at this time.  IVC has been rescinded.  Amount and/or Complexity of Data Reviewed Labs: ordered.  Risk OTC drugs. Prescription drug management.        Final diagnoses:  None    ED Discharge Orders     None          Shane [name] 07/12/24 1912    Shane Lot, MD 07/12/24 [phone]

## 2024-07-12 NOTE — ED Notes (Signed)
 Pt belongings given back, Pt getting dressed at this time. Discharge papers reviewed with pt and d/c.

## 2024-07-12 NOTE — ED Notes (Addendum)
 Pt provided maroon scrubs at this time. Pt wanded by [employer]. Pt belongings bag secured in locker #3. Pt remains cooperative at this time.

## 2024-11-15 ENCOUNTER — Ambulatory Visit (HOSPITAL_COMMUNITY): Payer: MEDICAID

## 2024-11-21 ENCOUNTER — Ambulatory Visit (HOSPITAL_COMMUNITY): Payer: MEDICAID
